# Patient Record
Sex: Female | Born: 1935 | Race: Black or African American | Hispanic: No | Smoking: Never smoker
Health system: Southern US, Community
[De-identification: ages and names within clinical notes are randomized; demographics above are authoritative.]

## PROBLEM LIST (undated history)

## (undated) DIAGNOSIS — I10 Essential (primary) hypertension: Secondary | ICD-10-CM

## (undated) DIAGNOSIS — E785 Hyperlipidemia, unspecified: Secondary | ICD-10-CM

## (undated) DIAGNOSIS — I2510 Atherosclerotic heart disease of native coronary artery without angina pectoris: Secondary | ICD-10-CM

## (undated) HISTORY — DX: Atherosclerotic heart disease of native coronary artery without angina pectoris: I25.10

## (undated) HISTORY — DX: Hyperlipidemia, unspecified: E78.5

## (undated) HISTORY — DX: Essential (primary) hypertension: I10

## (undated) HISTORY — PX: HYSTEROTOMY: SHX1776

## (undated) HISTORY — PX: PTCA: SHX146

---

## 2003-12-05 HISTORY — PX: OTHER SURGICAL HISTORY: SHX169

## 2004-12-04 ENCOUNTER — Inpatient Hospital Stay (HOSPITAL_COMMUNITY): Admission: EM | Admit: 2004-12-04 | Discharge: 2004-12-07 | Payer: Self-pay | Admitting: Family Medicine

## 2008-02-17 ENCOUNTER — Ambulatory Visit: Payer: Self-pay | Admitting: Vascular Surgery

## 2011-04-18 NOTE — Procedures (Signed)
CAROTID DUPLEX EXAM   INDICATION:  Carotid bruit.  Patient experienced one episode of right  hand numbness approximately one year ago.   HISTORY:  Diabetes:  No.  Cardiac:  MI in 2006, stent.  Hypertension:  Yes.  Smoking:  No.  Previous Surgery:  Cardiac.  CV History:  CVA in 2002 (right-sided numbness)  Amaurosis Fugax No, Paresthesias No, Hemiparesis No                                       RIGHT             LEFT  Brachial systolic pressure:         140               150  Brachial Doppler waveforms:         WNL               WNL  Vertebral direction of flow:        Antegrade         Antegrade  DUPLEX VELOCITIES (cm/sec)  CCA peak systolic                   83                72  ECA peak systolic                   85                91  ICA peak systolic                   78                58  ICA end diastolic                   23                17  PLAQUE MORPHOLOGY:                  Calcific          Calcific  PLAQUE AMOUNT:                      Mild              Mild  PLAQUE LOCATION:                    ICA               ICA   IMPRESSION:  1. Tortuous left common carotid artery.  2. Tortuous internal carotid arteries bilaterally, right > left.  3. Bilateral 1-39% internal carotid artery stenoses.  4. Patent external carotid arteries.  5. Bilateral vertebral arteries with antegrade flow.   Preliminary report faxed to Dr. Silva Bandy office on February 17, 2008 at  11:15.   ___________________________________________  Janetta Hora. Fields, MD   PB/MEDQ  D:  02/17/2008  T:  02/17/2008  Job:  [id number]

## 2011-04-21 NOTE — Cardiovascular Report (Signed)
NAME:  BRANDEN, VINE NO.:  [id number]   MEDICAL RECORD NO.:  [id number]          PATIENT TYPE:  INP   LOCATION:  6599                         FACILITY:  [facility]   PHYSICIAN:  Peter M. Swaziland, M.D.  DATE OF BIRTH:  10-Sep-1933   DATE OF PROCEDURE:  DATE OF DISCHARGE:                              CARDIAC CATHETERIZATION   INDICATIONS FOR PROCEDURE:  Ms. Legore, a 77 year old black female with  history of hypertension, presented with non-Q-wave myocardial infarction.  Diagnostic angiogram demonstrated critical stenosis in the distal left  circumflex coronary artery 95%. Chest had an 80% stenosis in the proximal  first obtuse marginal vessel. Access for the diagnostic case was via right  femoral artery using the 6-French sheath. A total of 180 cc of Omnipaque was  used for both the diagnostic and interventional procedure.   MEDICATIONS FOR THE INTERVENTION:  1.  The patient was on IV Integrilin.  2.  She was given heparin 3600 units with subsequent ACT of 267.  3.  She was given nitroglycerin intracoronary 200 mcg x2.  4.  Plavix 300 mg p.o.   The patient tolerated procedure well without complications.   PROCEDURE NOTE:  We initially intervened on the distal circumflex lesion.  Equipment used was a 6-French left __________ 3.5, 0.014 high-torque floppy,  extra support. The lesion was crossed without difficulty. It was predilated  using a 2.5 x 15 mm Maverick balloon dilating to 6 atmospheres. We then  stented this lesion using a 2.5 x 16 mm Taxus drug-eluting stent. This was  deployed at 9 atmospheres and postdilated to 14 atmospheres. This showed an  excellent angiographic result with 0% residual stenosis and TIMI grade 3  flow. We then addressed the first obtuse marginal lesion. Using the same  wire this lesion was also crossed without difficulty. We used, again, the  2.5 x 15 mm Maverick balloon dilating this 6 atmospheres. This was then  stented using a 2.5 x 12  mm Taxus drug-eluting stent. This was dilated to 9  atmospheres and then post dilated to 12 atmospheres with an excellent  angiographic result and 0% residual stenosis.   FINAL INTERPRETATION:  1.  Successful stenting of the distal left circumflex coronary artery and      the first obtuse marginal branch.      PMJ/MEDQ  D:  12/06/2004  T:  12/06/2004  Job:  [id number]   cc:   Elmore Guise., M.D.  [address]  [place], [place] [address]  Fax: [phone]

## 2011-04-21 NOTE — Discharge Summary (Signed)
NAMEKAYLINN, DEDIC             ACCOUNT NO.:  [id number]   MEDICAL RECORD NO.:  [id number]          PATIENT TYPE:  INP   LOCATION:  6533                         FACILITY:  [facility]   PHYSICIAN:  Elmore Guise., M.D.DATE OF BIRTH:  October 10, 1933   DATE OF ADMISSION:  12/04/2004  DATE OF DISCHARGE:  12/07/2004                                 DISCHARGE SUMMARY   DISCHARGE DIAGNOSES:  1.  Coronary artery disease, status post non-ST elevation myocardial      infarction with percutaneous coronary intervention to left circumflex      and obtuse marginal-1.  2.  Dyslipidemia.  3.  Hypertension.   HISTORY OF PRESENT ILLNESS:  The patient is a 77 year old, African-American  female with past medical history of hypertension and dyslipidemia who  presented on December 04, 2004, for evaluation of chest pain.   HOSPITAL COURSE:  The patient was admitted to the hospital for treatment of  non-ST elevation MI.  She was treated aggressively with Lovenox,  Integrillin, aspirin, statin and beta-blocker.  She underwent cardiac  catheterization on December 06, 2004, showing 95% obstruction in the mid  circumflex as well as 60-70% obstruction in a moderate sized OM-1 vessel  found on diagnostic catheterization.  Elective intervention was then  performed by Dr. Peter Swaziland.  The patient had a 2.5 x 16 mm Taxus stent  placed in the mid to distal circumflex with 0% residual as well as a 2.5 x  12 mm Taxus placed at the osteal/proximal portion of the OM-1 with 0%  residual stenosis.   Post procedure, the patient has done well.  She has had no recurrent chest  pain.  She has been up and ambulatory.  Her arteriotomy site looks well with  no significant bruising or hematoma noted.  The patient will be discharged  home today.   DISCHARGE MEDICATIONS:  1.  Atenolol 100 mg daily.  2.  Aspirin 81 mg daily.  3.  Hydrochlorothiazide 25 mg daily.  4.  Lisinopril 40 mg daily.  5.  Crestor 10 mg daily.  6.  Plavix  75 mg daily.  7.  K-Dur 20 mEq daily.  8.  Nitroglycerin on a p.r.n. basis.  9.  Tylenol extra strength one to two every 6 hours as needed.   ACTIVITY:  Restricted to no strenuous activity or heavy lifting greater than  48 hours.  After 48 hours, the patient may slowly start back to normal  routine.   DIET:  Low cholesterol, low fat diet.   WOUND CARE:  Routine post catheterization instructions.   FOLLOW UP:  Follow up with Dr. Rosine Abe of [facility] in 2-  3 weeks and follow up with her primary care physician in 4-6 weeks.  The  patient is to call the office at [phone], for an appointment.       TWK/MEDQ  D:  12/07/2004  T:  12/07/2004  Job:  [id number]

## 2011-04-21 NOTE — H&P (Signed)
NAME:  Amy Stevenson, Amy Stevenson             ACCOUNT NO.:  [id number]   MEDICAL RECORD NO.:  [id number]          PATIENT TYPE:  EMS   LOCATION:  URG                          FACILITY:  [facility]   PHYSICIAN:  Elmore Guise., M.D.DATE OF BIRTH:  [birth year]   DATE OF ADMISSION:  12/04/2004  DATE OF DISCHARGE:                                HISTORY & PHYSICAL   REASON FOR ADMISSION:  Chest pain.   PRIMARY CARE PHYSICIAN:  Dr. Scot Jun __________, [place], [place].   HISTORY OF PRESENT ILLNESS:  A 77 year old African-American female with past  medical history of hypertension and dyslipidemia who presents with off and  on chest pain since Wednesday.  The patient reports increasing activity  secondary to visiting her kids here in [place] and notes occasional  chest pain which she describes as burning and tightness, coming with heavy  exertion.  This radiates to her neck, improves with rest, it is associated  mild dyspnea and diaphoresis.  She denies any chest pain prior to this last  Wednesday, denies any nausea, vomiting, cough, orthopnea, PND, lower  extremity edema, syncope, or presyncope.  No palpitations.  She does report  off and on joint aches for some time which primarily happen in her knees and  ankles.  She lives independently and has no significant exertional  limitations.  She did recently have surgery back in 9/05 secondary to a mass  on her right ovary which was reportedly benign.   REVIEW OF SYSTEMS:  Otherwise negative.   CURRENT MEDICATIONS:  1.  Hydrochlorothiazide 25 mg daily.  2.  Atenolol 100 mg daily (just increased from 50 mg).  3.  Aspirin 325 mg daily.   She stopped her Lipitor and Zetia in the last couple of months.   FAMILY HISTORY:  Positive for cancer in her mother and stroke in her father.   SOCIAL HISTORY:  She lives in [place], [place], she is down visiting her  daughter and son.  She has no tobacco or alcohol.  She is retired at the  current time.   PHYSICAL EXAMINATION:  VITAL SIGNS:  She is afebrile, pulse is in the 70s,  respiratory rate 16, blood pressure 150/70, she is saturating 95% on 2  liters.  GENERAL:  She is a very pleasant, elderly, African-American female alert and  oriented x4 and in no acute distress.  HEENT:  Normal.  NECK:  Supple, no lymphadenopathy, 2+ carotids, no JVD, no bruits.  LUNGS:  Clear.  HEART:  Regular with normal S1/S2, no significant murmurs, rubs or gallops.  ABDOMEN:  Soft, nontender, non-distended.  EXTREMITIES:  Warm with 2+ pulses and no edema.  NEUROLOGIC:  She is intact with no focal deficits.   Chest x-ray is pending.  EKG is normal sinus rhythm, 73 per minute, LVH with  strain pattern noted with T wave inversions in the inferolateral leads.   LABORATORY DATA:  Hemoglobin 13.9.  BUN 14, creatinine 1.0, potassium 3.2.  Coags as well as cardiac enzymes are pending at time of dictation.   IMPRESSION:  1.  Chest pain.  2.  Multiple cardiac risk  factors, including hypertension, dyslipidemia and      age.   PLAN:  We will admit for observation, serial cardiac enzymes, continue  aspirin, beta-blocker, Lovenox, and nitroglycerin.  We will check fasting  lipid panel in the morning.  We will add low-dose ACE inhibitor to her  regimen, obtain serial cardiac enzymes and if enzymes remain negative we  will obtain stress Cardiolite in the morning, otherwise if positive then  catheterize.  Discussed this at length with the patient and son.       TWK/MEDQ  D:  12/04/2004  T:  12/04/2004  Job:  [id number]

## 2011-04-21 NOTE — Cardiovascular Report (Signed)
NAME:  Amy Stevenson, Amy Stevenson             ACCOUNT NO.:  [id number]   MEDICAL RECORD NO.:  [id number]          PATIENT TYPE:  INP   LOCATION:  3743                         FACILITY:  [facility]   PHYSICIAN:  Elmore Guise., M.D.DATE OF BIRTH:  [birth year]   DATE OF PROCEDURE:  DATE OF DISCHARGE:                              CARDIAC CATHETERIZATION   CARDIAC CATHETERIZATION   PROCEDURE:  Left heart catheterization, coronary angiogram, LV angiogram.   PROCEDURE DESCRIPTION:  The patient was brought to the cardiac  catheterization lab after appropriate informed consent.  She was prepped and  draped in a sterile fashion.  A 6 French sheath was placed in the right  femoral artery without difficulty.  Coronary angiogram was then performed  without complication followed by LV angiogram.   FINDINGS:  1.  Left main:  Normal.  2.  LAD:  Moderate-sized vessel wrapping around the apex with mild luminal      irregularities.  3.  LCX:  Is nondominant with 95% mid-obstruction, a small to moderate-sized      vessel.  4.  OM-1:  Has ostial 50-60% stenosis.  5.  RCA:  Is dominant with mild luminal irregularities.  6.  LVEF:  60-65%, no wall motion abnormalities, LVEDP was 18 mmHg.   IMPRESSION:  1.  Single vessel obstructive coronary artery disease.  2.  Preserved left ventricular systolic function.   PLAN:  1.  Maximal medical therapy with ACE, beta blocker, statin, and aspirin.  2.  Elective PCI of circumflex.       TWK/MEDQ  D:  12/06/2004  T:  12/06/2004  Job:  [id number]

## 2012-03-25 ENCOUNTER — Encounter: Payer: Self-pay | Admitting: *Deleted

## 2012-08-08 ENCOUNTER — Encounter: Payer: Self-pay | Admitting: *Deleted

## 2017-11-08 ENCOUNTER — Ambulatory Visit: Payer: Self-pay | Admitting: Interventional Cardiology

## 2017-11-23 DIAGNOSIS — I10 Essential (primary) hypertension: Secondary | ICD-10-CM | POA: Insufficient documentation

## 2017-11-23 DIAGNOSIS — I2510 Atherosclerotic heart disease of native coronary artery without angina pectoris: Secondary | ICD-10-CM | POA: Insufficient documentation

## 2017-11-23 DIAGNOSIS — E785 Hyperlipidemia, unspecified: Secondary | ICD-10-CM | POA: Insufficient documentation

## 2017-12-11 ENCOUNTER — Encounter: Payer: Self-pay | Admitting: Cardiovascular Disease

## 2017-12-11 ENCOUNTER — Ambulatory Visit: Payer: Medicare HMO | Admitting: Cardiovascular Disease

## 2017-12-11 ENCOUNTER — Encounter (INDEPENDENT_AMBULATORY_CARE_PROVIDER_SITE_OTHER): Payer: Self-pay

## 2017-12-11 VITALS — BP 158/86 | HR 60 | Ht 60.0 in | Wt 158.4 lb

## 2017-12-11 DIAGNOSIS — I509 Heart failure, unspecified: Secondary | ICD-10-CM | POA: Diagnosis not present

## 2017-12-11 DIAGNOSIS — I2510 Atherosclerotic heart disease of native coronary artery without angina pectoris: Secondary | ICD-10-CM

## 2017-12-11 DIAGNOSIS — E782 Mixed hyperlipidemia: Secondary | ICD-10-CM | POA: Diagnosis not present

## 2017-12-11 DIAGNOSIS — I447 Left bundle-branch block, unspecified: Secondary | ICD-10-CM

## 2017-12-11 DIAGNOSIS — R002 Palpitations: Secondary | ICD-10-CM | POA: Diagnosis not present

## 2017-12-11 LAB — LIPID PANEL
CHOL/HDL RATIO: 4.4 ratio (ref 0.0–4.4)
CHOLESTEROL TOTAL: 242 mg/dL — AB (ref 100–199)
HDL: 55 mg/dL (ref >39)
LDL CALC: 169 mg/dL — AB (ref 0–99)
Triglycerides: 90 mg/dL (ref 0–149)
VLDL CHOLESTEROL CAL: 18 mg/dL (ref 5–40)

## 2017-12-11 LAB — BASIC METABOLIC PANEL
BUN/Creatinine Ratio: 13 (ref 12–28)
BUN: 12 mg/dL (ref 8–27)
CALCIUM: 9.8 mg/dL (ref 8.7–10.3)
CO2: 24 mmol/L (ref 20–29)
Chloride: 100 mmol/L (ref 96–106)
Creatinine, Ser: 0.93 mg/dL (ref 0.57–1.00)
GFR calc Af Amer: 66 mL/min/{1.73_m2} (ref >59)
GFR, EST NON AFRICAN AMERICAN: 57 mL/min/{1.73_m2} — AB (ref >59)
GLUCOSE: 88 mg/dL (ref 65–99)
Potassium: 4.3 mmol/L (ref 3.5–5.2)
SODIUM: 139 mmol/L (ref 134–144)

## 2017-12-11 LAB — HEPATIC FUNCTION PANEL
ALBUMIN: 3.7 g/dL (ref 3.5–4.7)
ALK PHOS: 190 IU/L — AB (ref 39–117)
ALT: 22 IU/L (ref 0–32)
AST: 28 IU/L (ref 0–40)
Bilirubin Total: 1.1 mg/dL (ref 0.0–1.2)
Bilirubin, Direct: 0.67 mg/dL — ABNORMAL HIGH (ref 0.00–0.40)
Total Protein: 8.6 g/dL — ABNORMAL HIGH (ref 6.0–8.5)

## 2017-12-11 LAB — TSH: TSH: 1.48 u[IU]/mL (ref 0.450–4.500)

## 2017-12-11 NOTE — Progress Notes (Signed)
Cardiology Office Note:    Date:  12/11/2017   ID:  Amy BurgerMargaret Stevenson, DOB 11/28/1933, MRN [id number]  PCP:  System, Pcp Not In  Cardiologist:  Kristeen MissPhilip , MD    Referring MD: No ref. provider found   Problem list 1.  Congestive heart failure 2.  Essential hypertension 3.  Hyperlipidemia 4.  CAD :   S/p stenting of the LCx  - 2006    Chief Complaint  Patient presents with  . Congestive Heart Failure    History of Present Illness:    Pt seen with Daughter, Dennie Bibleat.  Amy Stevenson is a 84 y.o. female with a hx of hypertension, hyperlipidemia with a recent diagnosis of congestive heart failure.  We are asked to see her today by Jolee EwingVanessa Hairston, FNT ( [facility]) for further evaluation of her congestive heart failure. Previous pat of Dr. Reyes IvanKersey She had a myocardial infarction in 2006 and had stenting of her left circumflex artery by Dr. SwazilandJordan.  She has had some progressive shortness of breath for the past couple months.  She was in the hospital the week before Thanksgiving up in [place].  She was diuresed and felt much better.  She had an echocardiogram during that hospitalization.  We do not have the results of that echocardiogram   She was scheduled to have a stress test.  She did not feel up to the stress test and wanted to wait and see me before she reschedule the stress test.  Since the hospitalization she has had some heart flutters. She is sleeping better ( was having PND and orthopnea ) , ankle swelling is better  Has not had any chest pain  Does not get any exercise.   Is able to do her housework .  Lives by herself. Does her own shopping.     Does have some DOE with bringing in the groceries.     Past Medical History:  Diagnosis Date  . Coronary artery disease   . Dyslipidemia   . Hypertension     Past Surgical History:  Procedure Laterality Date  . HYSTERRICTOMY    . PTCA    . 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 aspirin 81 MG tablet Take 81 mg by mouth daily.  Marland Kitchen. atorvastatin (LIPITOR) 40 MG tablet Take 40 mg by mouth at bedtime.  . carvedilol (COREG) 25 MG tablet Take 25 mg by mouth 2 (two) times daily with a meal.  . chlorthalidone (HYGROTON) 25 MG tablet Take 25 mg by mouth daily.  . famotidine (PEPCID) 20 MG tablet Take 20 mg by mouth daily.  . ferrous sulfate 220 (44 Fe) MG/5ML solution Take 220 mg by mouth daily.  . furosemide (LASIX) 20 MG tablet Take 20 mg by mouth. EVERY Monday, Wednesday, FRIDAY  . levothyroxine (SYNTHROID, LEVOTHROID) 25 MCG tablet Take 25 mcg by mouth daily.  Marland Kitchen. lisinopril (PRINIVIL,ZESTRIL) 40 MG tablet Take 40 mg by mouth daily.  . nitroGLYCERIN (NITROSTAT) 0.4 MG SL tablet Place 0.4 mg under the tongue every 5 (five) minutes as needed.     Allergies:   Patient has no known allergies.   Social History   Socioeconomic History  . Marital status: Widowed    Spouse name: None  . Number of children: 2  . Years of education: None  . Highest education level: None  Social Needs  . Financial resource strain: None  . Food insecurity -  worry: None  . Food insecurity - inability: None  . Transportation needs - medical: None  . Transportation needs - non-medical: None  Occupational History  . Occupation: retired  Tobacco Use  . Smoking status: Never Smoker  . Smokeless tobacco: Never Used  Substance and Sexual Activity  . Alcohol use: No  . Drug use: No  . Sexual activity: Not Currently  Other Topics Concern  . None  Social History Narrative  . None     Family History: The patient's family history includes Cancer in her mother and unknown relative; Cancer - Lung in her father; Diabetes in her brother; Stroke in her unknown relative. ROS:   Please see the history of present illness.     All other systems reviewed and are negative.  EKGs/Labs/Other Studies Reviewed:    The following studies were reviewed today:   EKG:  EKG is  ordered today.  The ekg  ordered today demonstrates  NSR at 60. LBBB . No ST or T wave changes.   Recent Labs: No results found for requested labs within last 8760 hours.  Recent Lipid Panel No results found for: CHOL, TRIG, HDL, CHOLHDL, VLDL, LDLCALC, LDLDIRECT  Physical Exam:    VS:  BP (!) 158/86 (BP Location: Left Arm)   Pulse 60   Ht 5' (1.524 m)   Wt 158 lb 6.4 oz (71.8 kg)   SpO2 98%   BMI 30.94 kg/m     Wt Readings from Last 3 Encounters:  12/11/17 158 lb 6.4 oz (71.8 kg)     GEN:  Well nourished, well developed in no acute distress, pleasant elderly female,  HEENT: Normal NECK: No JVD; No carotid bruits LYMPHATICS: No lymphadenopathy CARDIAC: RR, no murmurs, rubs, gallops RESPIRATORY:  Clear to auscultation without rales, wheezing or rhonchi  ABDOMEN: Soft, non-tender, non-distended MUSCULOSKELETAL:  No edema; No deformity  SKIN: Warm and dry NEUROLOGIC:  Alert and oriented x 3 PSYCHIATRIC:  Normal affect   ASSESSMENT:    No diagnosis found. PLAN:    In order of problems listed above:  1. 1.  Acute on chronic congestive heart failure.    Amy Stevenson was hospitalized in November with congestive heart failure.  I do not have the results of the echocardiogram to know whether this is systolic or diastolic or both.  She has a history of a myocardial infarction in 2008 and is status post stenting of her circumflex artery.  She was having lots of PND and orthopnea and now all of the symptoms are significantly better.  She has a left bundle branch block.  She was scheduled to have a stress test up in [place] but she wanted to wait and have a stress test done down here.  We will continue with her same medications.  Will request records from [facility] in [place], [place]   2.  Coronary artery disease: She is status post stenting of her left circumflex artery approximately 10 years ago.  She will continue on aspirin.  She is not having any angina.  2.  Hyperlipidemia: Continue current  medications.  Will check labs today.  3.  Essential hypertension: Advised her to avoid eating excessive salt.   Medication Adjustments/Labs and Tests Ordered: Current medicines are reviewed at length with the patient today.  Concerns regarding medicines are outlined above.  No orders of the defined types were placed in this encounter.  No orders of the defined types were placed in this encounter.   Signed, Kristeen Miss,  MD  12/11/2017 9:19 AM    [facility]

## 2017-12-11 NOTE — Patient Instructions (Signed)
Medication Instructions:  Your physician recommends that you continue on your current medications as directed. Please refer to the Current Medication list given to you today.   Labwork: TODAY - cholesterol, TSH, liver panel, basic metabolic panel   Testing/Procedures: Your physician has requested that you have a lexiscan myoview. For further information please visit [email or web address]. Please follow instruction sheet, as given.   Follow-Up: Your physician recommends that you schedule a follow-up appointment in: 3 months with Dr. Elease HashimotoNahser   If you need a refill on your cardiac medications before your next appointment, please call your pharmacy.   Thank you for choosing [facility]! Eligha BridegroomMichelle Harshal Sirmon, RN [phone]

## 2018-01-31 ENCOUNTER — Telehealth (HOSPITAL_COMMUNITY): Payer: Self-pay | Admitting: *Deleted

## 2018-01-31 NOTE — Telephone Encounter (Signed)
Attempted to call patient regarding upcoming nuclear test, no answer, unable to leave a message.  Amy Stevenson, [name] [name]

## 2018-02-04 ENCOUNTER — Telehealth (HOSPITAL_COMMUNITY): Payer: Self-pay | Admitting: *Deleted

## 2018-02-04 NOTE — Telephone Encounter (Signed)
Attempted to leave message on voicemail in reference to upcoming appointment scheduled for 02/05/18 but mailbox full. Amy Stevenson, Amy Stevenson

## 2018-02-05 ENCOUNTER — Ambulatory Visit (HOSPITAL_COMMUNITY): Payer: Medicare HMO | Attending: Cardiology

## 2018-02-05 DIAGNOSIS — I10 Essential (primary) hypertension: Secondary | ICD-10-CM | POA: Diagnosis not present

## 2018-02-05 DIAGNOSIS — I2510 Atherosclerotic heart disease of native coronary artery without angina pectoris: Secondary | ICD-10-CM

## 2018-02-05 DIAGNOSIS — R002 Palpitations: Secondary | ICD-10-CM | POA: Diagnosis not present

## 2018-02-05 DIAGNOSIS — E782 Mixed hyperlipidemia: Secondary | ICD-10-CM

## 2018-02-05 DIAGNOSIS — R0609 Other forms of dyspnea: Secondary | ICD-10-CM | POA: Insufficient documentation

## 2018-02-05 DIAGNOSIS — I509 Heart failure, unspecified: Secondary | ICD-10-CM | POA: Diagnosis not present

## 2018-02-05 DIAGNOSIS — I447 Left bundle-branch block, unspecified: Secondary | ICD-10-CM

## 2018-02-05 LAB — MYOCARDIAL PERFUSION IMAGING
CHL CUP NUCLEAR SRS: 4
CHL CUP NUCLEAR SSS: 8
CHL CUP RESTING HR STRESS: 64 {beats}/min
LV sys vol: 50 mL
LVDIAVOL: 108 mL (ref 46–106)
NUC STRESS TID: 0.91
Peak HR: 83 {beats}/min
RATE: 0.32
SDS: 4

## 2018-02-05 MED ORDER — TECHNETIUM TC 99M TETROFOSMIN IV KIT
32.3000 | PACK | Freq: Once | INTRAVENOUS | Status: AC | PRN
  Administered 2018-02-05: 32.3 via INTRAVENOUS
  Filled 2018-02-05: qty 33

## 2018-02-05 MED ORDER — REGADENOSON 0.4 MG/5ML IV SOLN
0.4000 mg | Freq: Once | INTRAVENOUS | Status: AC
  Administered 2018-02-05: 0.4 mg via INTRAVENOUS

## 2018-02-05 MED ORDER — TECHNETIUM TC 99M TETROFOSMIN IV KIT
10.9000 | PACK | Freq: Once | INTRAVENOUS | Status: AC | PRN
  Administered 2018-02-05: 10.9 via INTRAVENOUS
  Filled 2018-02-05: qty 11

## 2018-03-07 ENCOUNTER — Ambulatory Visit: Payer: Medicare HMO | Admitting: Cardiovascular Disease

## 2018-03-08 ENCOUNTER — Ambulatory Visit: Payer: Medicare HMO | Admitting: Cardiovascular Disease

## 2018-03-08 ENCOUNTER — Encounter: Payer: Self-pay | Admitting: Cardiovascular Disease

## 2018-03-08 VITALS — BP 172/68 | HR 66 | Ht 60.0 in | Wt 160.0 lb

## 2018-03-08 DIAGNOSIS — I10 Essential (primary) hypertension: Secondary | ICD-10-CM

## 2018-03-08 DIAGNOSIS — E782 Mixed hyperlipidemia: Secondary | ICD-10-CM

## 2018-03-08 MED ORDER — AMLODIPINE BESYLATE 5 MG PO TABS: 5.0000 mg | ORAL_TABLET | Freq: Every day | ORAL | 3 refills | Status: DC

## 2018-03-08 NOTE — Patient Instructions (Addendum)
Medication Instructions:  Your physician has recommended you make the following change in your medication:   START Amlodipine (Norvasc) 5 mg once daily TAKE YOUR Atorvastatin    Labwork: Your physician recommends that you return for lab work in: 3 months on the day of your appointment or a few days before You will need to FAST for this appointment - nothing to eat or drink after midnight the night before except water.   Testing/Procedures: None Ordered   Follow-Up: Your physician recommends that you schedule a follow-up appointment in: 3 months with a PA or NP on Dr. Harvie BridgeNahser's team    If you need a refill on your cardiac medications before your next appointment, please call your pharmacy.   Thank you for choosing [facility]! Eligha BridegroomMichelle Soffia Doshier, RN [phone]

## 2018-03-08 NOTE — Progress Notes (Signed)
Cardiology Office Note:    Date:  03/08/2018   ID:  Amy Stevenson, DOB 12-18-1932, MRN [id number]  PCP:  System, Pcp Not In  Cardiologist:  Kristeen Miss, MD    Referring MD: No ref. provider found   Problem list 1.  Congestive heart failure 2.  Essential hypertension 3.  Hyperlipidemia 4.  CAD :   S/p stenting of the LCx  - 2006    Chief Complaint  Patient presents with  . Hypertension    History of Present Illness:    Pt seen with Daughter, [name].  Amy Stevenson is a 84 y.o. female with a hx of hypertension, hyperlipidemia with a recent diagnosis of congestive heart failure.  We are asked to see her today by [name], FNT ( [place]) for further evaluation of her congestive heart failure. Previous pat of Dr. Reyes Ivan She had a myocardial infarction in 2006 and had stenting of her left circumflex artery by Dr. Sw[place]il[place].  She has had some progressive shortness of breath for the past couple months.  She was in the hospital the week before Thanksgiving up in [place].  She was diuresed and felt much better.  She had an echocardiogram during that hospitalization.  We do not have the results of that echocardiogram   She was scheduled to have a stress test.  She did not feel up to the stress test and wanted to wait and see me before she reschedule the stress test.  Since the hospitalization she has had some heart flutters. She is sleeping better ( was having PND and orthopnea ) , ankle swelling is better  Has not had any chest pain  Does not get any exercise.   Is able to do her housework .  Lives by herself. Does her own shopping.     Does have some DOE with bringing in the groceries.   March 08, 2018:  Amy Stevenson is seen today for follow-up of her hypertension, hyperlipidemia and congestive heart failure. She was seen in March and was reported having some increased shortness of breath.  Myoview study was done which revealed an ejection fraction of 54%.  She did not have any  evidence of ischemia.  Has been under lots of stress  - sister died several months ago and this am found out that her sister in law had passed.  BP is elevated today .  Still using salt regularly   Continues to have some left sided chest pain ( normal myoview last month)  Thinks it may be gas.   Past Medical History:  Diagnosis Date  . Coronary artery disease   . Dyslipidemia   . Hypertension     Past Surgical History:  Procedure Laterality Date  . HYSTEROTOMY    . PTCA    . 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 aspirin 81 MG tablet Take 81 mg by mouth daily.  Marland Kitchen atorvastatin (LIPITOR) 40 MG tablet Take 40 mg by mouth at bedtime.  . carvedilol (COREG) 25 MG tablet Take 25 mg by mouth 2 (two) times daily with a meal.  . chlorthalidone (HYGROTON) 25 MG tablet Take 25 mg by mouth daily.  . famotidine (PEPCID) 20 MG tablet Take 20 mg by mouth daily.  . ferrous sulfate 220 (44 Fe) MG/5ML solution Take 220 mg by mouth daily.  . furosemide (LASIX) 20 MG tablet Take 20 mg by mouth. EVERY Monday, Wednesday, FRIDAY  .  levothyroxine (SYNTHROID, LEVOTHROID) 25 MCG tablet Take 25 mcg by mouth daily.  Marland Kitchen lisinopril (PRINIVIL,ZESTRIL) 40 MG tablet Take 40 mg by mouth daily.  . nitroGLYCERIN (NITROSTAT) 0.4 MG SL tablet Place 0.4 mg under the tongue every 5 (five) minutes as needed.     Allergies:   Patient has no known allergies.   Social History   Socioeconomic History  . Marital status: Widowed    Spouse name: Not on file  . Number of children: 2  . Years of education: Not on file  . Highest education level: Not on file  Occupational History  . Occupation: retired  Engineer, production  . Financial resource strain: Not on file  . Food insecurity:    Worry: Not on file    Inability: Not on file  . Transportation needs:    Medical: Not on file    Non-medical: Not on file  Tobacco Use  . Smoking status: Never Smoker  . Smokeless  tobacco: Never Used  Substance and Sexual Activity  . Alcohol use: No  . Drug use: No  . Sexual activity: Not Currently  Lifestyle  . Physical activity:    Days per week: Not on file    Minutes per session: Not on file  . Stress: Not on file  Relationships  . Social connections:    Talks on phone: Not on file    Gets together: Not on file    Attends religious service: Not on file    Active member of club or organization: Not on file    Attends meetings of clubs or organizations: Not on file    Relationship status: Not on file  Other Topics Concern  . Not on file  Social History Narrative  . Not on file     Family History: The patient's family history includes Cancer in her mother and unknown relative; Cancer - Lung in her father; Diabetes in her brother; Stroke in her unknown relative. ROS:   Please see the history of present illness.     All other systems reviewed and are negative.  EKGs/Labs/Other Studies Reviewed:    The following studies were reviewed today:  EKG:  EKG is  ordered today.  The ekg ordered today demonstrates  NSR at 60. LBBB . No ST or T wave changes.   Recent Labs: 12/11/2017: ALT 22; BUN 12; Creatinine, Ser 0.93; Potassium 4.3; Sodium 139; TSH 1.480  Recent Lipid Panel    Component Value Date/Time   CHOL 242 (H) 12/11/2017 1025   TRIG 90 12/11/2017 1025   HDL 55 12/11/2017 1025   CHOLHDL 4.4 12/11/2017 1025   LDLCALC 169 (H) 12/11/2017 1025   Physical Exam: Blood pressure (!) 172/68, pulse 66, height 5' (1.524 m), weight 160 lb (72.6 kg), SpO2 97 %.  GEN:  Well nourished, well developed in no acute distress HEENT: Normal NECK: No JVD; No carotid bruits LYMPHATICS: No lymphadenopathy CARDIAC: RR  no murmurs, rubs, gallops RESPIRATORY:  Clear to auscultation without rales, wheezing or rhonchi  ABDOMEN: Soft, non-tender, non-distended MUSCULOSKELETAL:  No edema; No deformity  SKIN: Warm and dry NEUROLOGIC:  Alert and oriented x  3   ASSESSMENT:    1. Mixed hyperlipidemia   2. Essential hypertension    PLAN:    In order of problems listed above:  1.  Acute on chronic congestive heart failure.      has presumed diastolic CHF.     Needs better BP control  2.  Coronary artery disease:  She is status post stenting of her left circumflex artery approximately 10 years ago.  She will continue on aspirin.  She is not having any angina.  2.  Hyperlipidemia: Lipids were elevated the last time we checked.  She had not been taking the atorvastatin on a regular basis. She still is not taking atorvastatin on a regular basis.  We have encouraged her to take atorvastatin regularly so that we can get her cholesterol levels down.  3.  Essential hypertension: Advised her to avoid eating excessive salt. Start Amlodipine 5 mg a day .   Medication Adjustments/Labs and Tests Ordered: Current medicines are reviewed at length with the patient today.  Concerns regarding medicines are outlined above.  Orders Placed This Encounter  Procedures  . Lipid Profile  . Basic Metabolic Panel (BMET)  . Hepatic function panel   Meds ordered this encounter  Medications  . amLODipine (NORVASC) 5 MG tablet    Sig: Take 1 tablet (5 mg total) by mouth daily.    Dispense:  90 tablet    Refill:  3    Signed, Kristeen MissPhilip Nahser, MD  03/08/2018 10:38 AM    [facility]

## 2018-06-04 ENCOUNTER — Ambulatory Visit: Payer: Medicare HMO | Admitting: Cardiovascular Disease

## 2018-06-04 ENCOUNTER — Other Ambulatory Visit: Payer: Medicare HMO | Admitting: *Deleted

## 2018-06-04 ENCOUNTER — Encounter: Payer: Self-pay | Admitting: Cardiovascular Disease

## 2018-06-04 VITALS — BP 150/58 | HR 68 | Ht 60.0 in | Wt 159.0 lb

## 2018-06-04 DIAGNOSIS — E782 Mixed hyperlipidemia: Secondary | ICD-10-CM | POA: Diagnosis not present

## 2018-06-04 DIAGNOSIS — I10 Essential (primary) hypertension: Secondary | ICD-10-CM

## 2018-06-04 DIAGNOSIS — I5032 Chronic diastolic (congestive) heart failure: Secondary | ICD-10-CM

## 2018-06-04 LAB — LIPID PANEL
CHOLESTEROL TOTAL: 225 mg/dL — AB (ref 100–199)
Chol/HDL Ratio: 4.1 ratio (ref 0.0–4.4)
HDL: 55 mg/dL (ref >39)
LDL Calculated: 154 mg/dL — ABNORMAL HIGH (ref 0–99)
TRIGLYCERIDES: 78 mg/dL (ref 0–149)
VLDL Cholesterol Cal: 16 mg/dL (ref 5–40)

## 2018-06-04 LAB — HEPATIC FUNCTION PANEL
ALBUMIN: 4.1 g/dL (ref 3.5–4.7)
ALT: 20 IU/L (ref 0–32)
AST: 28 IU/L (ref 0–40)
Alkaline Phosphatase: 144 IU/L — ABNORMAL HIGH (ref 39–117)
BILIRUBIN TOTAL: 0.4 mg/dL (ref 0.0–1.2)
BILIRUBIN, DIRECT: 0.16 mg/dL (ref 0.00–0.40)
Total Protein: 7.5 g/dL (ref 6.0–8.5)

## 2018-06-04 LAB — BASIC METABOLIC PANEL
BUN/Creatinine Ratio: 10 — ABNORMAL LOW (ref 12–28)
BUN: 9 mg/dL (ref 8–27)
CALCIUM: 9.4 mg/dL (ref 8.7–10.3)
CO2: 22 mmol/L (ref 20–29)
Chloride: 105 mmol/L (ref 96–106)
Creatinine, Ser: 0.89 mg/dL (ref 0.57–1.00)
GFR, EST AFRICAN AMERICAN: 69 mL/min/{1.73_m2} (ref >59)
GFR, EST NON AFRICAN AMERICAN: 60 mL/min/{1.73_m2} (ref >59)
Glucose: 83 mg/dL (ref 65–99)
Potassium: 5 mmol/L (ref 3.5–5.2)
Sodium: 142 mmol/L (ref 134–144)

## 2018-06-04 MED ORDER — CHLORTHALIDONE 25 MG PO TABS: 25.0000 mg | ORAL_TABLET | Freq: Every day | ORAL | 3 refills | Status: DC

## 2018-06-04 MED ORDER — FUROSEMIDE 20 MG PO TABS: 20.0000 mg | ORAL_TABLET | ORAL | 3 refills | Status: DC

## 2018-06-04 NOTE — Patient Instructions (Signed)
Medication Instructions:  Your physician recommends that you continue on your current medications as directed. Please refer to the Current Medication list given to you today.   Labwork: Your physician recommends that you return for lab work in: 6 months on the day of or a few days before your office visit   You will need to FAST for this appointment - nothing to eat or drink after midnight the night before except water.   Testing/Procedures: None Ordered   Follow-Up: Your physician wants you to follow-up in: 6 months with a PA or NP on Dr. Nahser's team. You will receive a reminder letter in the mail two months in advance. If you don't receive a letter, please call our office to schedule the follow-up appointment.   If you need a refill on your cardiac medications before your next appointment, please call your pharmacy.   Thank you for choosing [facility]! Chrsitopher Wik, RN [phone]    

## 2018-06-04 NOTE — Progress Notes (Signed)
Cardiology Office Note:    Date:  06/04/2018   ID:  Amy Stevenson, DOB Apr 02, 1933, MRN [id number]  PCP:  System, Pcp Not In  Cardiologist:  Kristeen Miss, MD    Referring MD: No ref. provider found   Problem list 1.  Congestive heart failure 2.  Essential hypertension 3.  Hyperlipidemia 4.  CAD :   S/p stenting of the LCx  - 2006    Chief Complaint  Patient presents with  . Congestive Heart Failure  . Hyperlipidemia     Pt seen with Daughter, Amy Stevenson.  Amy Stevenson is a 84 y.o. female with a hx of hypertension, hyperlipidemia with a recent diagnosis of congestive heart failure.  We are asked to see her today by [name], FNT ( [place]) for further evaluation of her congestive heart failure. Previous pat of Dr. Reyes Ivan She had a myocardial infarction in 2006 and had stenting of her left circumflex artery by Dr. [place].  She has had some progressive shortness of breath for the past couple months.  She was in the hospital the week before Thanksgiving up in [place].  She was diuresed and felt much better.  She had an echocardiogram during that hospitalization.  We do not have the results of that echocardiogram   She was scheduled to have a stress test.  She did not feel up to the stress test and wanted to wait and see me before she reschedule the stress test.  Since the hospitalization she has had some heart flutters. She is sleeping better ( was having PND and orthopnea ) , ankle swelling is better  Has not had any chest pain  Does not get any exercise.   Is able to do her housework .  Lives by herself. Does her own shopping.     Does have some DOE with bringing in the groceries.   March 08, 2018:  [name] is seen today for follow-up of her hypertension, hyperlipidemia and congestive heart failure. She was seen in March and was reported having some increased shortness of breath.  Myoview study was done which revealed an ejection fraction of 54%.  She did not have any  evidence of ischemia.  Has been under lots of stress  - sister died several months ago and this am found out that her sister in law had passed.  BP is elevated today .  Still using salt regularly   Continues to have some left sided chest pain ( normal myoview last month)  Thinks it may be gas.   June 04, 2018:  Amy Stevenson is seen today for follow-up of her chronic diastolic congestive heart failure, hypertension and hyperlipidemia.  She has a remote history of coronary artery disease with stenting of the left circumflex artery in 2006.    Is taking her meds.    Still eats salt frequently  Ran out of her Chlorthalidone and lasix last week   Past Medical History:  Diagnosis Date  . Coronary artery disease   . Dyslipidemia   . Hypertension     Past Surgical History:  Procedure Laterality Date  . HYSTEROTOMY    . PTCA    . 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 amLODipine (NORVASC) 5 MG tablet Take 1 tablet (5 mg total) by mouth daily.  Marland Kitchen aspirin 81 MG tablet Take 81 mg by mouth daily.  Marland Kitchen atorvastatin (LIPITOR) 40 MG tablet Take 40 mg  by mouth at bedtime.  . carvedilol (COREG) 25 MG tablet Take 25 mg by mouth 2 (two) times daily with a meal.  . famotidine (PEPCID) 20 MG tablet Take 20 mg by mouth daily.  . ferrous sulfate 220 (44 Fe) MG/5ML solution Take 220 mg by mouth daily.  Marland Kitchen levothyroxine (SYNTHROID, LEVOTHROID) 25 MCG tablet Take 25 mcg by mouth daily.  Marland Kitchen lisinopril (PRINIVIL,ZESTRIL) 40 MG tablet Take 40 mg by mouth daily.  . nitroGLYCERIN (NITROSTAT) 0.4 MG SL tablet Place 0.4 mg under the tongue every 5 (five) minutes as needed.  . [DISCONTINUED] chlorthalidone (HYGROTON) 25 MG tablet Take 25 mg by mouth daily.  . [DISCONTINUED] furosemide (LASIX) 20 MG tablet Take 20 mg by mouth. EVERY Monday, Wednesday, FRIDAY     Allergies:   Patient has no known allergies.   Social History   Socioeconomic History  . Marital  status: Widowed    Spouse name: Not on file  . Number of children: 2  . Years of education: Not on file  . Highest education level: Not on file  Occupational History  . Occupation: retired  Engineer, production  . Financial resource strain: Not on file  . Food insecurity:    Worry: Not on file    Inability: Not on file  . Transportation needs:    Medical: Not on file    Non-medical: Not on file  Tobacco Use  . Smoking status: Never Smoker  . Smokeless tobacco: Never Used  Substance and Sexual Activity  . Alcohol use: No  . Drug use: No  . Sexual activity: Not Currently  Lifestyle  . Physical activity:    Days per week: Not on file    Minutes per session: Not on file  . Stress: Not on file  Relationships  . Social connections:    Talks on phone: Not on file    Gets together: Not on file    Attends religious service: Not on file    Active member of club or organization: Not on file    Attends meetings of clubs or organizations: Not on file    Relationship status: Not on file  Other Topics Concern  . Not on file  Social History Narrative  . Not on file     Family History: The patient's family history includes Cancer in her mother and unknown relative; Cancer - Lung in her father; Diabetes in her brother; Stroke in her unknown relative. ROS:   Please see the history of present illness.     All other systems reviewed and are negative.  EKGs/Labs/Other Studies Reviewed:    The following studies were reviewed today:  EKG:   Recent Labs: 12/11/2017: ALT 22; BUN 12; Creatinine, Ser 0.93; Potassium 4.3; Sodium 139; TSH 1.480  Recent Lipid Panel    Component Value Date/Time   CHOL 242 (H) 12/11/2017 1025   TRIG 90 12/11/2017 1025   HDL 55 12/11/2017 1025   CHOLHDL 4.4 12/11/2017 1025   LDLCALC 169 (H) 12/11/2017 1025   Physical Exam: Blood pressure (!) 150/58, pulse 68, height 5' (1.524 m), weight 159 lb (72.1 kg), SpO2 98 %. ( BP is likely elevated because she has been  out of Lasix and chlorthalidone for a week )   GEN:   Elderly , frail female, NAD  HEENT: Normal NECK: No JVD; No carotid bruits LYMPHATICS: No lymphadenopathy CARDIAC: RR, no murmurs, rubs, gallops RESPIRATORY:  Clear to auscultation without rales, wheezing or rhonchi  ABDOMEN: Soft, non-tender, non-distended  MUSCULOSKELETAL:  No edema; No deformity  SKIN: Warm and dry NEUROLOGIC:  Alert and oriented x 3    ASSESSMENT:    1. Mixed hyperlipidemia   2. Chronic diastolic heart failure (HCC)   3. Essential hypertension    PLAN:    In order of problems listed above:  1.  Acute on chronic congestive heart failure.    Seems to be fairly stable.  Continue current medications.  Will refill her chlorthalidone and Lasix.    2.  Coronary artery disease: She is status post stenting of her left circumflex artery approximately 10 years ago.  She denies any episodes of angina.  2.  Hyperlipidemia: Her last lipid levels were fairly elevated.  She admits that she does not take the atorvastatin on a regular basis.  3.  Essential hypertension:     To new current medications.  Medication Adjustments/Labs and Tests Ordered: Current medicines are reviewed at length with the patient today.  Concerns regarding medicines are outlined above.  Orders Placed This Encounter  Procedures  . Lipid Profile  . Basic Metabolic Panel (BMET)  . Hepatic function panel   Meds ordered this encounter  Medications  . furosemide (LASIX) 20 MG tablet    Sig: Take 1 tablet (20 mg total) by mouth 3 (three) times a week. EVERY Monday, Wednesday, FRIDAY    Dispense:  36 tablet    Refill:  3  . chlorthalidone (HYGROTON) 25 MG tablet    Sig: Take 1 tablet (25 mg total) by mouth daily.    Dispense:  90 tablet    Refill:  3    Signed, Kristeen MissPhilip Secundino Ellithorpe, MD  06/04/2018 10:33 AM    [facility]

## 2018-06-28 IMAGING — NM NM MISC PROCEDURE
6 series · 36 of 36 positions shown · non-contrast
Comparison: none

[Series 1: wbr_s-proj_st stress-sum-em · 6.40mm/px · 6 of 64 frames shown]
[frame 6/64]
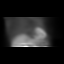
[frame 16/64]
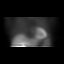
[frame 27/64]
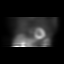
[frame 38/64]
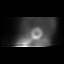
[frame 48/64]
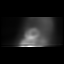
[frame 59/64]
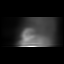

[Series 1: wbr_s-proj_st stress-gsp · 6.40mm/px · 6 of 512 frames shown]
[frame 43/512]
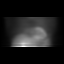
[frame 128/512]
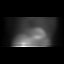
[frame 214/512]
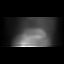
[frame 299/512]
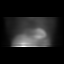
[frame 384/512]
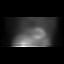
[frame 470/512]
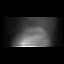

[Series 1: wbr_r-proj_st rest · 6.40mm/px · 6 of 64 frames shown]
[frame 6/64]
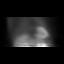
[frame 16/64]
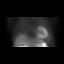
[frame 27/64]
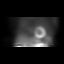
[frame 38/64]
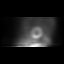
[frame 48/64]
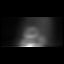
[frame 59/64]
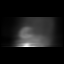

[Series 1: stress-gsp · 6.40mm/px · 6 of 511 frames shown]
[frame 43/511]
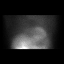
[frame 128/511]
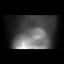
[frame 213/511]
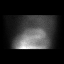
[frame 298/511]
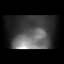
[frame 383/511]
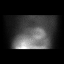
[frame 469/511]
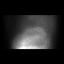

[Series 1: stress-sum-em · 6.40mm/px · 6 of 64 frames shown]
[frame 6/64]
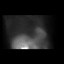
[frame 16/64]
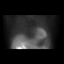
[frame 27/64]
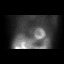
[frame 38/64]
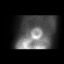
[frame 48/64]
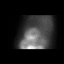
[frame 59/64]
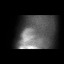

[Series 1: rest · 6.40mm/px · 6 of 64 frames shown]
[frame 6/64]
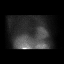
[frame 16/64]
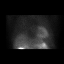
[frame 27/64]
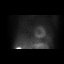
[frame 38/64]
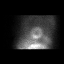
[frame 48/64]
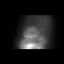
[frame 59/64]
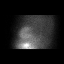

[36 of 36 positions shown; findings below may reference images not displayed]

Canned report from images found in remote index.

Refer to host system for actual result text.

## 2018-12-05 ENCOUNTER — Encounter: Payer: Self-pay | Admitting: Cardiology

## 2018-12-05 ENCOUNTER — Ambulatory Visit: Payer: Medicare HMO | Admitting: Cardiology

## 2018-12-05 ENCOUNTER — Telehealth: Payer: Self-pay | Admitting: Cardiology

## 2018-12-05 VITALS — BP 138/58 | HR 74 | Ht 60.0 in | Wt 153.4 lb

## 2018-12-05 DIAGNOSIS — I2510 Atherosclerotic heart disease of native coronary artery without angina pectoris: Secondary | ICD-10-CM | POA: Diagnosis not present

## 2018-12-05 DIAGNOSIS — I5032 Chronic diastolic (congestive) heart failure: Secondary | ICD-10-CM | POA: Diagnosis not present

## 2018-12-05 DIAGNOSIS — I10 Essential (primary) hypertension: Secondary | ICD-10-CM | POA: Diagnosis not present

## 2018-12-05 DIAGNOSIS — E785 Hyperlipidemia, unspecified: Secondary | ICD-10-CM

## 2018-12-05 MED ORDER — ROSUVASTATIN CALCIUM 20 MG PO TABS: 20.0000 mg | ORAL_TABLET | Freq: Every day | ORAL | 3 refills | Status: DC

## 2018-12-05 NOTE — Patient Instructions (Signed)
Medication Instructions:  STOP : Atorvastatin   START: Rosuvastatin (crestor) 20 mg daily   If you need a refill on your cardiac medications before your next appointment, please call your pharmacy.   Lab work: TODAY: Nurse, children's recommends that you return for a FASTING lipid profile: in 6 weeks  If you have labs (blood work) drawn today and your tests are completely normal, you will receive your results only by: Marland Kitchen MyChart Message (if you have MyChart) OR . A paper copy in the mail If you have any lab test that is abnormal or we need to change your treatment, we will call you to review the results.  Testing/Procedures: None  Follow-Up: At [facility], you and your health needs are our priority.  As part of our continuing mission to provide you with exceptional heart care, we have created designated Provider Care Teams.  These Care Teams include your primary Cardiologist (physician) and Advanced Practice Providers (APPs -  Physician Assistants and Nurse Practitioners) who all work together to provide you with the care you need, when you need it. You will need a follow up appointment in:  6 months.  Please call our office 2 months in advance to schedule this appointment.  You may see Kristeen Miss, MD or one of the following Advanced Practice Providers on your designated Care Team: Tereso Newcomer, PA-C [place], [place] . Berton Bon, NP  Any Other Special Instructions Will Be Listed Below (If Applicable).

## 2018-12-05 NOTE — Telephone Encounter (Signed)
Medical records requested from [facility] (Dr. Altamease Oiler) 12/05/18 vlm

## 2018-12-05 NOTE — Progress Notes (Signed)
Cardiology Office Note:    Date:  12/05/2018   ID:  Amy Stevenson, DOB 07/26/1933, MRN [id number]  PCP:  System, Pcp Not In  Cardiologist:  Kristeen MissPhilip Nahser, MD  Referring MD: No ref. provider found   Chief Complaint  Patient presents with  . Follow-up    CAD, CHF    History of Present Illness:    Amy Stevenson is a 85 y.o. female with a past medical history significant for CAD MI and s/p stent to circumflex 2006, CHF, hypertension, hyperlipidemia.  An evaluation of her HF a Myoview was done in 02/2018 and was normal.  Amy Stevenson was last seen in the office by Dr. Elease HashimotoNahser 06/04/2018 at which time she was doing well on chlorthalidone and Lasix.  Dr. Elease HashimotoNahser did note that she was still eating salty food frequently.  Amy Stevenson is here today for follow-up alone. She does not exercise or walk much but she does housework and a lot of cooking. She does her shopping. She lost her youngest sister in March and her brother last month. She had a lot to do with those losses. Her children and grandchildren keep her busy. She has had no chest pain/pressure, shortness of breath. No edema or orthopnea.  Pt reports having a long time murmur and she thinks she had an [place] fairly recently [place] Leonie GreenLingle.   Home BPs 120's-130's/50's.   Past Medical History:  Diagnosis Date  . Coronary artery disease   . Dyslipidemia   . Hypertension     Past Surgical History:  Procedure Laterality Date  . HYSTEROTOMY    . PTCA    . 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 amLODipine (NORVASC) 5 MG tablet Take 1 tablet (5 mg total) by mouth daily.  Marland Kitchen. aspirin 81 MG tablet Take 81 mg by mouth daily.  . carvedilol (COREG) 25 MG tablet Take 25 mg by mouth 2 (two) times daily with a meal.  . chlorthalidone (HYGROTON) 25 MG tablet Take 1 tablet (25 mg total) by mouth daily.  . famotidine (PEPCID) 20 MG tablet Take 20 mg by mouth daily.  . furosemide  (LASIX) 20 MG tablet Take 1 tablet (20 mg total) by mouth 3 (three) times a week. EVERY Monday, Wednesday, FRIDAY  . levothyroxine (SYNTHROID, LEVOTHROID) 25 MCG tablet Take 25 mcg by mouth daily.  Marland Kitchen. lisinopril (PRINIVIL,ZESTRIL) 40 MG tablet Take 40 mg by mouth daily.  . nitroGLYCERIN (NITROSTAT) 0.4 MG SL tablet Place 0.4 mg under the tongue every 5 (five) minutes as needed.  . [DISCONTINUED] atorvastatin (LIPITOR) 40 MG tablet Take 40 mg by mouth at bedtime.     Allergies:   Patient has no known allergies.   Social History   Socioeconomic History  . Marital status: Widowed    Spouse name: Not on file  . Number of children: 2  . Years of education: Not on file  . Highest education level: Not on file  Occupational History  . Occupation: retired  Engineer, productionocial Needs  . Financial resource strain: Not on file  . Food insecurity:    Worry: Not on file    Inability: Not on file  . Transportation needs:    Medical: Not on file    Non-medical: Not on file  Tobacco Use  . Smoking status: Never Smoker  . Smokeless tobacco: Never Used  Substance and Sexual Activity  . Alcohol use: No  .  Drug use: No  . Sexual activity: Not Currently  Lifestyle  . Physical activity:    Days per week: Not on file    Minutes per session: Not on file  . Stress: Not on file  Relationships  . Social connections:    Talks on phone: Not on file    Gets together: Not on file    Attends religious service: Not on file    Active member of club or organization: Not on file    Attends meetings of clubs or organizations: Not on file    Relationship status: Not on file  Other Topics Concern  . Not on file  Social History Narrative  . Not on file     Family History: The patient's family history includes Cancer in her mother and another family member; Cancer - Lung in her father; Diabetes in her brother; Stroke in an other family member. ROS:   Please see the history of present illness.     All other systems  reviewed and are negative.  EKGs/Labs/Other Studies Reviewed:    The following studies were reviewed today:  Lexiscan Myoview 02/05/2018 Study Highlights    Nuclear stress EF: 54%.  There was no ST segment deviation noted during stress.  The study is normal.   Normal pharmacologic nuclear stress test with no evidence for prior infarct or ischemia.       EKG:  EKG is not ordered today.    Recent Labs: 12/11/2017: TSH 1.480 06/04/2018: ALT 20; BUN 9; Creatinine, Ser 0.89; Potassium 5.0; Sodium 142   Recent Lipid Panel    Component Value Date/Time   CHOL 225 (H) 06/04/2018 0833   TRIG 78 06/04/2018 0833   HDL 55 06/04/2018 0833   CHOLHDL 4.1 06/04/2018 0833   LDLCALC 154 (H) 06/04/2018 0833    Physical Exam:    VS:  BP (!) 138/58   Pulse 74   Ht 5' (1.524 m)   Wt 153 lb 6.4 oz (69.6 kg)   SpO2 98%   BMI 29.96 kg/m     Wt Readings from Last 3 Encounters:  12/05/18 153 lb 6.4 oz (69.6 kg)  06/04/18 159 lb (72.1 kg)  03/08/18 160 lb (72.6 kg)     Physical Exam  Constitutional: She is oriented to person, place, and time. She appears well-developed and well-nourished.  HENT:  Head: Normocephalic and atraumatic.  Neck: Normal range of motion. Neck supple. No JVD present.  Cardiovascular: Normal rate, regular rhythm and intact distal pulses. Exam reveals no gallop and no friction rub.  Murmur heard.  Harsh midsystolic murmur is present with a grade of 2/6 at the upper right sternal border radiating to the neck. Pulmonary/Chest: Effort normal and breath sounds normal. No respiratory distress. She has no wheezes. She has no rales.  Abdominal: Soft. Bowel sounds are normal.  Musculoskeletal: Normal range of motion.        General: No deformity or edema.  Neurological: She is alert and oriented to person, place, and time.  Skin: Skin is warm and dry.  Psychiatric: She has a normal mood and affect. Her behavior is normal. Judgment and thought content normal.  Vitals  reviewed.    ASSESSMENT:    1. Chronic diastolic heart failure (HCC)   2. Coronary artery disease involving native coronary artery of native heart without angina pectoris   3. Dyslipidemia   4. Essential hypertension    PLAN:    In order of problems listed above:  1.  Chronic diastolic heart failure: Volume stable. Continue lasix 3 days per week.   2.  CAD: S/p MI and DES to the circumflex in 2006.  She continues on aspirin, BB and statin. No angina.   3.  Hyperlipidemia: On atorvastatin 40 mg.  LDL was 154 on 06/04/2018 at which time the patient had admitted not taking her statin regularly.She thinks her diet has been better. She is not very clear on how regularly she is taking statin, she says atorvastatin causes her leg cramps. She says she took crestor in the past without problems and would like to try that again. She is not too keen on considering PCSK9I at this time.   4.  Hypertension: Amlodipine 5 mg, carvedilol 25 mg twice daily, chlorthalidone 25 mg, Lasix 20 mg 3 times per week, lisinopril 40 mg. BP well controlled.  Will update metabolic panel  5. Cardiac murmur in aortic region: pt states that she has had known murmur since the 1980's. I discussed doing echo to evaluate but she says she had recent echo in [place] per Dr. Leonie GreenLingle. Will try to get that record.    Medication Adjustments/Labs and Tests Ordered: Current medicines are reviewed at length with the patient today.  Concerns regarding medicines are outlined above. Labs and tests ordered and medication changes are outlined in the patient instructions below:  Patient Instructions  Medication Instructions:  STOP : Atorvastatin   START: Rosuvastatin (crestor) 20 mg daily   If you need a refill on your cardiac medications before your next appointment, please call your pharmacy.   Lab work: TODAY: Nurse, children'sBMET  Your physician recommends that you return for a FASTING lipid profile: in 6 weeks  If you have labs (blood work)  drawn today and your tests are completely normal, you will receive your results only by: Marland Kitchen. MyChart Message (if you have MyChart) OR . A paper copy in the mail If you have any lab test that is abnormal or we need to change your treatment, we will call you to review the results.  Testing/Procedures: None  Follow-Up: At [facility], you and your health needs are our priority.  As part of our continuing mission to provide you with exceptional heart care, we have created designated Provider Care Teams.  These Care Teams include your primary Cardiologist (physician) and Advanced Practice Providers (APPs -  Physician Assistants and Nurse Practitioners) who all work together to provide you with the care you need, when you need it. You will need a follow up appointment in:  6 months.  Please call our office 2 months in advance to schedule this appointment.  You may see Kristeen MissPhilip Nahser, MD or one of the following Advanced Practice Providers on your designated Care Team: Tereso NewcomerScott Weaver, PA-C [place], [place] . Berton BonJanine Kinzi Frediani, NP  Any Other Special Instructions Will Be Listed Below (If Applicable).       Signed, Berton BonJanine Alamin Mccuiston, NP  12/05/2018 6:34 PM    [facility]

## 2018-12-06 ENCOUNTER — Telehealth: Payer: Self-pay | Admitting: Cardiology

## 2018-12-06 LAB — LIPID PANEL
CHOLESTEROL TOTAL: 220 mg/dL — AB (ref 100–199)
Chol/HDL Ratio: 4.1 ratio (ref 0.0–4.4)
HDL: 54 mg/dL (ref >39)
LDL CALC: 130 mg/dL — AB (ref 0–99)
TRIGLYCERIDES: 178 mg/dL — AB (ref 0–149)
VLDL CHOLESTEROL CAL: 36 mg/dL (ref 5–40)

## 2018-12-06 LAB — BASIC METABOLIC PANEL
BUN/Creatinine Ratio: 18 (ref 12–28)
BUN: 19 mg/dL (ref 8–27)
CALCIUM: 9.3 mg/dL (ref 8.7–10.3)
CHLORIDE: 99 mmol/L (ref 96–106)
CO2: 25 mmol/L (ref 20–29)
Creatinine, Ser: 1.05 mg/dL — ABNORMAL HIGH (ref 0.57–1.00)
GFR calc Af Amer: 57 mL/min/{1.73_m2} — ABNORMAL LOW (ref >59)
GFR calc non Af Amer: 49 mL/min/{1.73_m2} — ABNORMAL LOW (ref >59)
GLUCOSE: 116 mg/dL — AB (ref 65–99)
Potassium: 3.9 mmol/L (ref 3.5–5.2)
Sodium: 140 mmol/L (ref 134–144)

## 2018-12-06 NOTE — Telephone Encounter (Signed)
Medical records received from [facility]. 12/06/18 vlm

## 2019-01-16 ENCOUNTER — Other Ambulatory Visit: Payer: Medicare HMO | Admitting: *Deleted

## 2019-01-16 ENCOUNTER — Telehealth: Payer: Self-pay

## 2019-01-16 DIAGNOSIS — E782 Mixed hyperlipidemia: Secondary | ICD-10-CM

## 2019-01-16 LAB — HEPATIC FUNCTION PANEL
ALK PHOS: 81 IU/L (ref 39–117)
ALT: 13 IU/L (ref 0–32)
AST: 18 IU/L (ref 0–40)
Albumin: 4 g/dL (ref 3.6–4.6)
Bilirubin Total: 0.3 mg/dL (ref 0.0–1.2)
Bilirubin, Direct: 0.09 mg/dL (ref 0.00–0.40)
TOTAL PROTEIN: 7.6 g/dL (ref 6.0–8.5)

## 2019-01-16 LAB — LIPID PANEL
CHOLESTEROL TOTAL: 156 mg/dL (ref 100–199)
Chol/HDL Ratio: 2.7 ratio (ref 0.0–4.4)
HDL: 57 mg/dL (ref >39)
LDL Calculated: 81 mg/dL (ref 0–99)
TRIGLYCERIDES: 88 mg/dL (ref 0–149)
VLDL CHOLESTEROL CAL: 18 mg/dL (ref 5–40)

## 2019-01-16 NOTE — Telephone Encounter (Signed)
ORDERS

## 2019-04-12 ENCOUNTER — Other Ambulatory Visit: Payer: Self-pay | Admitting: Cardiovascular Disease

## 2019-06-11 ENCOUNTER — Telehealth: Payer: Self-pay | Admitting: Cardiology

## 2019-06-11 ENCOUNTER — Encounter: Payer: Self-pay | Admitting: Cardiology

## 2019-06-11 NOTE — Telephone Encounter (Signed)

## 2019-06-12 ENCOUNTER — Ambulatory Visit: Payer: Medicare HMO | Admitting: Cardiology

## 2019-06-16 ENCOUNTER — Other Ambulatory Visit: Payer: Self-pay | Admitting: Cardiovascular Disease

## 2019-06-19 ENCOUNTER — Other Ambulatory Visit: Payer: Self-pay

## 2019-06-19 ENCOUNTER — Encounter: Payer: Self-pay | Admitting: Cardiovascular Disease

## 2019-06-19 ENCOUNTER — Telehealth (INDEPENDENT_AMBULATORY_CARE_PROVIDER_SITE_OTHER): Payer: Medicare HMO | Admitting: Cardiovascular Disease

## 2019-06-19 VITALS — BP 129/63 | HR 67 | Ht 60.0 in | Wt 156.0 lb

## 2019-06-19 DIAGNOSIS — I5032 Chronic diastolic (congestive) heart failure: Secondary | ICD-10-CM

## 2019-06-19 DIAGNOSIS — Z7189 Other specified counseling: Secondary | ICD-10-CM

## 2019-06-19 DIAGNOSIS — I2510 Atherosclerotic heart disease of native coronary artery without angina pectoris: Secondary | ICD-10-CM | POA: Diagnosis not present

## 2019-06-19 DIAGNOSIS — E785 Hyperlipidemia, unspecified: Secondary | ICD-10-CM

## 2019-06-19 DIAGNOSIS — I10 Essential (primary) hypertension: Secondary | ICD-10-CM | POA: Diagnosis not present

## 2019-06-19 DIAGNOSIS — R000 Tachycardia, unspecified: Secondary | ICD-10-CM

## 2019-06-19 MED ORDER — CARVEDILOL 12.5 MG PO TABS: 12.5000 mg | ORAL_TABLET | Freq: Two times a day (BID) | ORAL | 3 refills | Status: DC

## 2019-06-19 NOTE — Progress Notes (Signed)
Virtual Visit via Telephone Note   This visit type was conducted due to national recommendations for restrictions regarding the COVID-19 Pandemic (e.g. social distancing) in an effort to limit this patient's exposure and mitigate transmission in our community.  Due to her co-morbid illnesses, this patient is at least at moderate risk for complications without adequate follow up.  This format is felt to be most appropriate for this patient at this time.  The patient did not have access to video technology/had technical difficulties with video requiring transitioning to audio format only (telephone).  All issues noted in this document were discussed and addressed.  No physical exam could be performed with this format.  Please refer to the patient's chart for her  consent to telehealth for [facility].   Date:  06/19/2019   ID:  Amy Stevenson, DOB 08/26/1933, MRN [id number]  Patient Location: Home Provider Location: Home  PCP:  System, Pcp Not In  Cardiologist:  Kristeen MissPhilip Freddi Schrager, MD  Electrophysiologist:  None   Problem list 1.  Congestive heart failure 2.  Essential hypertension 3.  Hyperlipidemia 4.  CAD :   S/p stenting of the LCx  - 2006       Chief Complaint  Patient presents with   Congestive Heart Failure   Hyperlipidemia     Pt seen with Daughter, Dennie Bibleat.  Amy Stevenson is a 85 y.o. female with a hx of hypertension, hyperlipidemia with a recent diagnosis of congestive heart failure.  We are asked to see her today by Jolee EwingVanessa Hairston, FNT ( [place]) for further evaluation of her congestive heart failure. Previous pat of Dr. Reyes IvanKersey She had a myocardial infarction in 2006 and had stenting of her left circumflex artery by Dr. [place].  She has had some progressive shortness of breath for the past couple months.  She was in the hospital the week before Thanksgiving up in [place].  She was diuresed and felt much better.  She had an echocardiogram during that  hospitalization.  We do not have the results of that echocardiogram   She was scheduled to have a stress test.  She did not feel up to the stress test and wanted to wait and see me before she reschedule the stress test.  Since the hospitalization she has had some heart flutters. She is sleeping better ( was having PND and orthopnea ) , ankle swelling is better  Has not had any chest pain  Does not get any exercise.   Is able to do her housework .  Lives by herself. Does her own shopping.     Does have some DOE with bringing in the groceries.   March 08, 2018:  Amy Stevenson is seen today for follow-up of her hypertension, hyperlipidemia and congestive heart failure. She was seen in March and was reported having some increased shortness of breath.  Myoview study was done which revealed an ejection fraction of 54%.  She did not have any evidence of ischemia.  Has been under lots of stress  - sister died several months ago and this am found out that her sister in law had passed.  BP is elevated today .  Still using salt regularly   Continues to have some left sided chest pain ( normal myoview last month)  Thinks it may be gas.   June 04, 2018:  Amy Stevenson is seen today for follow-up of her chronic diastolic congestive heart failure, hypertension and hyperlipidemia.  She has a remote history of coronary artery  disease with stenting of the left circumflex artery in 2006.    Is taking her meds.    Still eats salt frequently  Ran out of her Chlorthalidone and lasix last week    Evaluation Performed:  Follow-Up Visit  Chief Complaint:  CHF, CAD,  Hyperlipidemia    June 19, 2019    Amy Stevenson is a 85 y.o. female with hx of CAD, HTN,  Hyperlipidemia VS look great  Still uses some salt, not much  Last lipids in Feb. 2020 look good  Walking better,  Doing better . Does not exercise per se ,  Walks inside   The patient does not have symptoms concerning for COVID-19 infection (fever,  chills, cough, or new shortness of breath).    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   chlorthalidone (HYGROTON) 25 MG tablet Take 1 tablet (25 mg total) by mouth daily.   furosemide (LASIX) 20 MG tablet TAKE 1 TAB BY MOUTH 3 TIMES PER WEEK (MONDAYS, WEDNESDAY AND FRIDAY)   levothyroxine (SYNTHROID, LEVOTHROID) 25 MCG tablet Take 25 mcg by mouth daily.   nitroGLYCERIN (NITROSTAT) 0.4 MG SL tablet Place 0.4 mg under the tongue every 5 (five) minutes as needed.   rosuvastatin (CRESTOR) 20 MG tablet Take 1 tablet (20 mg total) by mouth daily.   [DISCONTINUED] carvedilol (COREG) 25 MG tablet Take 25 mg by mouth daily.     Allergies:   Patient has no known allergies.   Social History   Tobacco Use   Smoking status: Never Smoker   Smokeless tobacco: Never Used  Substance Use Topics   Alcohol use: No   Drug use: No     Family Hx: The patient's family history includes Cancer in her mother and another family member; Cancer - Lung in her father; Diabetes in her brother; Stroke in an other family member.  ROS:   Please see the history of present illness.     All other systems reviewed and are negative.   Prior CV studies:   The following studies were reviewed today:    Labs/Other Tests and Data Reviewed:    EKG:  No ECG reviewed.  Recent Labs: 12/05/2018: BUN 19; Creatinine, Ser 1.05; Potassium 3.9; Sodium 140 01/16/2019: ALT 13   Recent Lipid Panel Lab Results  Component Value Date/Time   CHOL 156 01/16/2019 10:14 AM   TRIG 88 01/16/2019 10:14 AM   HDL 57 01/16/2019 10:14 AM   CHOLHDL 2.7 01/16/2019 10:14 AM   LDLCALC 81  01/16/2019 10:14 AM    Wt Readings from Last 3 Encounters:  06/19/19 156 lb (70.8 kg)  12/05/18 153 lb 6.4 oz (69.6 kg)  06/04/18 159 lb (72.1 kg)     Objective:    Vital Signs:  BP 129/63    Pulse 67    Ht 5' (1.524 m)    Wt 156 lb (70.8 kg)    BMI 30.47 kg/m      ASSESSMENT & PLAN:    1. 1.  Coronary artery disease: Patient is not having any  episodes of angina.  Continue current medications.  2.  Chronic diastolic congestive heart failure: She is avoiding salt and she has been taking her medications.  She is not having any signs or symptoms of congestive heart failure  3.  Hyperlipidemia: She is now on rosuvastatin.  Her last lipid level in February, 2028 looked good.  Continue current medications.  4.  Hypertension: Blood pressure is well controlled today.  Continue current medications.  She is taking carvedilol 25 mg once a day.  We will change her dosing to carvedilol 12.5 mg twice a day.  I will see her in 6 months.  COVID-19 Education: The signs and symptoms of COVID-19 were discussed with the patient and how to seek care for testing (follow up with PCP or arrange E-visit).  The importance of social distancing was discussed today.  Time:   Today, I have spent  16 minutes with the patient with telehealth technology discussing the above problems.     Medication Adjustments/Labs and Tests Ordered: Current medicines are reviewed at length with the patient today.  Concerns regarding medicines are outlined above.   Tests Ordered: No orders of the defined types were placed in this encounter.    Medication Changes: Meds ordered this encounter  Medications   carvedilol (COREG) 12.5 MG tablet    Sig: Take 1 tablet (12.5 mg total) by mouth 2 (two) times daily.    Dispense:  180 tablet    Refill:  3    Follow Up:  Virtual Visit or In Person in 6 month(s)  Signed, Kristeen MissPhilip Morris Markham, MD  06/19/2019 10:40 AM    [facility]

## 2019-06-19 NOTE — Patient Instructions (Signed)
Medication Instructions:   Your physician has recommended you make the following change in your medication:  Stop Carvedilol 25MG   Start Carvedilol 12.5MG  1 tablet by mouth twice a day  If you need a refill on your cardiac medications before your next appointment, please call your pharmacy.   Lab work:  None ordered today  If you have labs (blood work) drawn today and your tests are completely normal, you will receive your results only by: Marland Kitchen MyChart Message (if you have MyChart) OR . A paper copy in the mail If you have any lab test that is abnormal or we need to change your treatment, we will call you to review the results.  Testing/Procedures:  None ordered today  Follow-Up: At [facility], you and your health needs are our priority.  As part of our continuing mission to provide you with exceptional heart care, we have created designated Provider Care Teams.  These Care Teams include your primary Cardiologist (physician) and Advanced Practice Providers (APPs -  Physician Assistants and Nurse Practitioners) who all work together to provide you with the care you need, when you need it. You will need a follow up appointment in:  6 months.  Please call our office 2 months in advance to schedule this appointment.  You may see Mertie Moores, MD or one of the following Advanced Practice Providers on your designated Care Team: Richardson Dopp, PA-C [place], [place] . Daune Perch, NP

## 2019-08-06 ENCOUNTER — Other Ambulatory Visit: Payer: Self-pay | Admitting: Cardiovascular Disease

## 2019-12-22 ENCOUNTER — Encounter: Payer: Self-pay | Admitting: Cardiovascular Disease

## 2019-12-22 ENCOUNTER — Other Ambulatory Visit: Payer: Self-pay

## 2019-12-22 ENCOUNTER — Ambulatory Visit (INDEPENDENT_AMBULATORY_CARE_PROVIDER_SITE_OTHER): Payer: Medicare HMO | Admitting: Cardiovascular Disease

## 2019-12-22 VITALS — BP 130/68 | HR 60 | Ht 60.0 in | Wt 155.8 lb

## 2019-12-22 DIAGNOSIS — I2510 Atherosclerotic heart disease of native coronary artery without angina pectoris: Secondary | ICD-10-CM | POA: Diagnosis not present

## 2019-12-22 DIAGNOSIS — E782 Mixed hyperlipidemia: Secondary | ICD-10-CM | POA: Diagnosis not present

## 2019-12-22 DIAGNOSIS — I10 Essential (primary) hypertension: Secondary | ICD-10-CM | POA: Diagnosis not present

## 2019-12-22 DIAGNOSIS — I5032 Chronic diastolic (congestive) heart failure: Secondary | ICD-10-CM | POA: Diagnosis not present

## 2019-12-22 MED ORDER — AMLODIPINE BESYLATE 5 MG PO TABS: 5.0000 mg | ORAL_TABLET | Freq: Every day | ORAL | 3 refills | Status: DC

## 2019-12-22 NOTE — Patient Instructions (Signed)
Medication Instructions:  Your physician recommends that you continue on your current medications as directed. Please refer to the Current Medication list given to you today.  *If you need a refill on your cardiac medications before your next appointment, please call your pharmacy*  Lab Work: Your physician recommends that you return for lab work in: 6 months on the day of or a few days before your office visit.  You will need to FAST for this appointment - nothing to eat or drink after midnight the night before except water.  If you have labs (blood work) drawn today and your tests are completely normal, you will receive your results only by: Marland Kitchen MyChart Message (if you have MyChart) OR . A paper copy in the mail If you have any lab test that is abnormal or we need to change your treatment, we will call you to review the results.   Testing/Procedures: None Ordered   Follow-Up: At [facility], you and your health needs are our priority.  As part of our continuing mission to provide you with exceptional heart care, we have created designated Provider Care Teams.  These Care Teams include your primary Cardiologist (physician) and Advanced Practice Providers (APPs -  Physician Assistants and Nurse Practitioners) who all work together to provide you with the care you need, when you need it.  Your next appointment:   6 month(s)  The format for your next appointment:   Either In Person or Virtual  Provider:   Tereso Newcomer, PA-C, Chelsea Aus, PA-C or Berton Bon, NP

## 2019-12-22 NOTE — Progress Notes (Signed)
Cardiology Office Note:    Date:  12/22/2019   ID:  Amy Stevenson, DOB 07-02-1933, MRN [id number]  PCP:  System, Pcp Not In  Cardiologist:  Kristeen Miss, MD    Referring MD: No ref. provider found   Problem list 1.  Congestive heart failure 2.  Essential hypertension 3.  Hyperlipidemia 4.  CAD :   S/p stenting of the LCx  - 2006    Chief Complaint  Patient presents with  . Congestive Heart Failure  . Hyperlipidemia     Pt seen with Daughter, [name].  Amy Stevenson is a 86 y.o. female with a hx of hypertension, hyperlipidemia with a recent diagnosis of congestive heart failure.  We are asked to see her today by [name], FNT ( [place]) for further evaluation of her congestive heart failure. Previous pat of Dr. Reyes Ivan She had a myocardial infarction in 2006 and had stenting of her left circumflex artery by Dr. [place]il[place].  She has had some progressive shortness of breath for the past couple months.  She was in the hospital the week before Thanksgiving up in [place].  She was diuresed and felt much better.  She had an echocardiogram during that hospitalization.  We do not have the results of that echocardiogram   She was scheduled to have a stress test.  She did not feel up to the stress test and wanted to wait and see me before she reschedule the stress test.  Since the hospitalization she has had some heart flutters. She is sleeping better ( was having PND and orthopnea ) , ankle swelling is better  Has not had any chest pain  Does not get any exercise.   Is able to do her housework .  Lives by herself. Does her own shopping.     Does have some DOE with bringing in the groceries.   March 08, 2018:  Amy Stevenson is seen today for follow-up of her hypertension, hyperlipidemia and congestive heart failure. She was seen in March and was reported having some increased shortness of breath.  Myoview study was done which revealed an ejection fraction of 54%.  She did not have any  evidence of ischemia.  Has been under lots of stress  - sister died several months ago and this am found out that her sister in law had passed.  BP is elevated today .  Still using salt regularly   Continues to have some left sided chest pain ( normal myoview last month)  Thinks it may be gas.   June 04, 2018:  Amy Stevenson is seen today for follow-up of her chronic diastolic congestive heart failure, hypertension and hyperlipidemia.  She has a remote history of coronary artery disease with stenting of the left circumflex artery in 2006.    Is taking her meds.    Still eats salt frequently  Ran out of her Chlorthalidone and lasix last week   December 22, 2019:  Amy Stevenson is seen today for a follow-up visit regarding her hypertension, coronary artery disease, dyslipidemia. Walks around her house,   No cP , no dyspnea  BP is ok Uses a little salt  Admits that she does not take her rosuvastatin every day   Past Medical History:  Diagnosis Date  . Coronary artery disease   . Dyslipidemia   . Hypertension     Past Surgical History:  Procedure Laterality Date  . HYSTEROTOMY    . PTCA    . 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 aspirin 81 MG tablet Take 81 mg by mouth daily.  . calcitRIOL (ROCALTROL) 0.5 MCG capsule Take 0.5 mcg by mouth daily.  . carvedilol (COREG) 12.5 MG tablet Take 1 tablet (12.5 mg total) by mouth 2 (two) times daily.  . chlorthalidone (HYGROTON) 25 MG tablet Take 1 tablet by mouth once daily  . FEROSUL 325 (65 Fe) MG tablet Take 325 mg by mouth 2 (two) times daily.  . furosemide (LASIX) 20 MG tablet TAKE 1 TAB BY MOUTH 3 TIMES PER WEEK (MONDAYS, WEDNESDAY AND FRIDAY)  . levothyroxine (SYNTHROID, LEVOTHROID) 25 MCG tablet Take 25 mcg by mouth daily.  . nitroGLYCERIN (NITROSTAT) 0.4 MG SL tablet Place 0.4 mg under the tongue every 5 (five) minutes as needed.  . potassium chloride (MICRO-K) 10 MEQ CR capsule  Take 10 mEq by mouth daily.  . rosuvastatin (CRESTOR) 20 MG tablet Take 1 tablet (20 mg total) by mouth daily.  . [DISCONTINUED] amLODipine (NORVASC) 5 MG tablet Take 1 tablet by mouth once daily     Allergies:   Patient has no known allergies.   Social History   Socioeconomic History  . Marital status: Widowed    Spouse name: Not on file  . Number of children: 2  . Years of education: Not on file  . Highest education level: Not on file  Occupational History  . Occupation: retired  Tobacco Use  . Smoking status: Never Smoker  . Smokeless tobacco: Never Used  Substance and Sexual Activity  . Alcohol use: No  . Drug use: No  . Sexual activity: Not Currently  Other Topics Concern  . Not on file  Social History Narrative  . Not on file   Social Determinants of Health   Financial Resource Strain:   . Difficulty of Paying Living Expenses: Not on file  Food Insecurity:   . Worried About Programme researcher, broadcasting/film/video in the Last Year: Not on file  . Ran Out of Food in the Last Year: Not on file  Transportation Needs:   . Lack of Transportation (Medical): Not on file  . Lack of Transportation (Non-Medical): Not on file  Physical Activity:   . Days of Exercise per Week: Not on file  . Minutes of Exercise per Session: Not on file  Stress:   . Feeling of Stress : Not on file  Social Connections:   . Frequency of Communication with Friends and Family: Not on file  . Frequency of Social Gatherings with Friends and Family: Not on file  . Attends Religious Services: Not on file  . Active Member of Clubs or Organizations: Not on file  . Attends Banker Meetings: Not on file  . Marital Status: Not on file     Family History: The patient's family history includes Cancer in her mother and another family member; Cancer - Lung in her father; Diabetes in her brother; Stroke in an other family member. ROS:   Please see the history of present illness.     All other systems reviewed  and are negative.  EKGs/Labs/Other Studies Reviewed:    The following studies were reviewed today:   Recent Labs: 01/16/2019: ALT 13  Recent Lipid Panel    Component Value Date/Time   CHOL 156 01/16/2019 1014   TRIG 88 01/16/2019 1014   HDL 57 01/16/2019 1014   CHOLHDL 2.7 01/16/2019 1014   LDLCALC 81 01/16/2019 1014  Physical Exam: Blood pressure 130/68, pulse 60, height 5' (1.524 m), weight 155 lb 12.8 oz (70.7 kg), SpO2 99 %.  GEN:  Elderly , frail female,  NAD  HEENT: Normal NECK: No JVD; No carotid bruits LYMPHATICS: No lymphadenopathy CARDIAC: RRR ,  Aft systolic murmur  RESPIRATORY:  Clear to auscultation without rales, wheezing or rhonchi  ABDOMEN: Soft, non-tender, non-distended MUSCULOSKELETAL:  No edema; No deformity  SKIN: Warm and dry NEUROLOGIC:  Alert and oriented x 3  EKG: December 22, 2019: Normal sinus rhythm at 60.  Nonspecific ST and T wave abnormalities.  ASSESSMENT:    1. Essential hypertension   2. Mixed hyperlipidemia   3. Coronary artery disease involving native coronary artery of native heart without angina pectoris   4. Chronic diastolic heart failure (HCC)    PLAN:    In order of problems listed above:  1.  Acute on chronic congestive heart failure.      Stable .    2.  Coronary artery disease:  No angina ,   2.  Hyperlipidemia:  Does not take her crestor as directed.   Encouraged her to take her Crestor as directed.  We will recheck lipids, liver enzymes, basic metabolic profile in 6 months.  She will follow-up with an APP in 6 months.  3.  Essential hypertension:     Blood pressure looks good.  Medication Adjustments/Labs and Tests Ordered: Current medicines are reviewed at length with the patient today.  Concerns regarding medicines are outlined above.  Orders Placed This Encounter  Procedures  . Lipid Profile  . Basic Metabolic Panel (BMET)  . Hepatic function panel  . EKG 12-Lead   Meds ordered this encounter   Medications  . amLODipine (NORVASC) 5 MG tablet    Sig: Take 1 tablet (5 mg total) by mouth daily.    Dispense:  90 tablet    Refill:  3    Signed, Mertie Moores, MD  12/22/2019 4:13 PM    [facility]

## 2020-06-21 ENCOUNTER — Other Ambulatory Visit: Payer: Self-pay

## 2020-06-21 ENCOUNTER — Ambulatory Visit (INDEPENDENT_AMBULATORY_CARE_PROVIDER_SITE_OTHER): Payer: Medicare HMO | Admitting: Cardiovascular Disease

## 2020-06-21 ENCOUNTER — Encounter: Payer: Self-pay | Admitting: Cardiovascular Disease

## 2020-06-21 VITALS — BP 130/60 | HR 68 | Ht 60.0 in | Wt 162.8 lb

## 2020-06-21 DIAGNOSIS — I10 Essential (primary) hypertension: Secondary | ICD-10-CM

## 2020-06-21 DIAGNOSIS — R000 Tachycardia, unspecified: Secondary | ICD-10-CM

## 2020-06-21 DIAGNOSIS — E782 Mixed hyperlipidemia: Secondary | ICD-10-CM

## 2020-06-21 DIAGNOSIS — I2510 Atherosclerotic heart disease of native coronary artery without angina pectoris: Secondary | ICD-10-CM | POA: Diagnosis not present

## 2020-06-21 DIAGNOSIS — I5032 Chronic diastolic (congestive) heart failure: Secondary | ICD-10-CM

## 2020-06-21 NOTE — Progress Notes (Signed)
Cardiology Office Note:    Date:  06/21/2020   ID:  Amy Stevenson, DOB Jan 24, 1933, MRN [id number]  PCP:  System, Pcp Not In  Cardiologist:  Kristeen Miss, MD    Referring MD: No ref. provider found   Problem list 1.  Congestive heart failure 2.  Essential hypertension 3.  Hyperlipidemia 4.  CAD :   S/p stenting of the LCx  - 2006    No chief complaint on file.    Pt seen with Daughter, [name].  Amy Stevenson is a 86 y.o. female with a hx of hypertension, hyperlipidemia with a recent diagnosis of congestive heart failure.  We are asked to see her today by [name], FNT ( [place]) for further evaluation of her congestive heart failure. Previous pat of Dr. Reyes Ivan She had a myocardial infarction in 2006 and had stenting of her left circumflex artery by Dr. [place].  She has had some progressive shortness of breath for the past couple months.  She was in the hospital the week before Thanksgiving up in [place].  She was diuresed and felt much better.  She had an echocardiogram during that hospitalization.  We do not have the results of that echocardiogram   She was scheduled to have a stress test.  She did not feel up to the stress test and wanted to wait and see me before she reschedule the stress test.  Since the hospitalization she has had some heart flutters. She is sleeping better ( was having PND and orthopnea ) , ankle swelling is better  Has not had any chest pain  Does not get any exercise.   Is able to do her housework .  Lives by herself. Does her own shopping.     Does have some DOE with bringing in the groceries.   March 08, 2018:  Amy Stevenson is seen today for follow-up of her hypertension, hyperlipidemia and congestive heart failure. She was seen in March and was reported having some increased shortness of breath.  Myoview study was done which revealed an ejection fraction of 54%.  She did not have any evidence of ischemia.  Has been under lots of stress  - sister  died several months ago and this am found out that her sister in law had passed.  BP is elevated today .  Still using salt regularly   Continues to have some left sided chest pain ( normal myoview last month)  Thinks it may be gas.   June 04, 2018:  Amy Stevenson is seen today for follow-up of her chronic diastolic congestive heart failure, hypertension and hyperlipidemia.  She has a remote history of coronary artery disease with stenting of the left circumflex artery in 2006.    Is taking her meds.    Still eats salt frequently  Ran out of her Chlorthalidone and lasix last week   December 22, 2019:  Amy Stevenson is seen today for a follow-up visit regarding her hypertension, coronary artery disease, dyslipidemia. Walks around her house,   No cP , no dyspnea  BP is ok Uses a little salt  Admits that she does not take her rosuvastatin every day   June 21, 2020:  Feels ok Able to do her normal activities at home.  Some exercise.  Sleeps well .  Has a primary MD near [place], [place]  She comes from a family of 14 children.   She has lost all of her siblings except 1  [place].  Past Medical History:  Diagnosis Date  . Coronary artery disease   . Dyslipidemia   . Hypertension     Past Surgical History:  Procedure Laterality Date  . HYSTEROTOMY    . PTCA    . 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 amLODipine (NORVASC) 5 MG tablet Take 1 tablet (5 mg total) by mouth daily.  Marland Kitchen aspirin 81 MG tablet Take 81 mg by mouth daily.  . calcitRIOL (ROCALTROL) 0.5 MCG capsule Take 0.5 mcg by mouth daily.  . carvedilol (COREG) 12.5 MG tablet Take 1 tablet (12.5 mg total) by mouth 2 (two) times daily.  . chlorthalidone (HYGROTON) 25 MG tablet Take 1 tablet by mouth once daily  . Ergocalciferol (VITAMIN D2 PO) Take 1 tablet by mouth daily.  . FEROSUL 325 (65 Fe) MG tablet Take 325 mg by mouth 2 (two) times daily.  Marland Kitchen  levothyroxine (SYNTHROID, LEVOTHROID) 25 MCG tablet Take 25 mcg by mouth daily.  . nitroGLYCERIN (NITROSTAT) 0.4 MG SL tablet Place 0.4 mg under the tongue every 5 (five) minutes as needed.  . rosuvastatin (CRESTOR) 20 MG tablet Take 1 tablet (20 mg total) by mouth daily.     Allergies:   Patient has no known allergies.   Social History   Socioeconomic History  . Marital status: Widowed    Spouse name: Not on file  . Number of children: 2  . Years of education: Not on file  . Highest education level: Not on file  Occupational History  . Occupation: retired  Tobacco Use  . Smoking status: Never Smoker  . Smokeless tobacco: Never Used  Vaping Use  . Vaping Use: Never used  Substance and Sexual Activity  . Alcohol use: No  . Drug use: No  . Sexual activity: Not Currently  Other Topics Concern  . Not on file  Social History Narrative  . Not on file   Social Determinants of Health   Financial Resource Strain:   . Difficulty of Paying Living Expenses:   Food Insecurity:   . Worried About Programme researcher, broadcasting/film/video in the Last Year:   . Barista in the Last Year:   Transportation Needs:   . Freight forwarder (Medical):   Marland Kitchen Lack of Transportation (Non-Medical):   Physical Activity:   . Days of Exercise per Week:   . Minutes of Exercise per Session:   Stress:   . Feeling of Stress :   Social Connections:   . Frequency of Communication with Friends and Family:   . Frequency of Social Gatherings with Friends and Family:   . Attends Religious Services:   . Active Member of Clubs or Organizations:   . Attends Banker Meetings:   Marland Kitchen Marital Status:      Family History: The patient's family history includes Cancer in her mother and another family member; Cancer - Lung in her father; Diabetes in her brother; Stroke in an other family member. ROS:   Please see the history of present illness.     All other systems reviewed and are negative.  EKGs/Labs/Other  Studies Reviewed:    The following studies were reviewed today:   Recent Labs: No results found for requested labs within last 8760 hours.  Recent Lipid Panel    Component Value Date/Time   CHOL 156 01/16/2019 1014   TRIG 88 01/16/2019 1014   HDL 57 01/16/2019 1014  CHOLHDL 2.7 01/16/2019 1014   LDLCALC 81 01/16/2019 1014    Physical Exam: Blood pressure 130/60, pulse 68, height 5' (1.524 m), weight 162 lb 12.8 oz (73.8 kg), SpO2 99 %.  GEN:  Elderly female,  NAD  HEENT: Normal NECK: No JVD; No carotid bruits LYMPHATICS: No lymphadenopathy CARDIAC: RRR , no murmurs, rubs, gallops RESPIRATORY:  Clear to auscultation without rales, wheezing or rhonchi  ABDOMEN: Soft, non-tender, non-distended MUSCULOSKELETAL:  No edema; No deformity  SKIN: Warm and dry NEUROLOGIC:  Alert and oriented x 3   EKG:    ASSESSMENT:    1. Mixed hyperlipidemia   2. Essential hypertension   3. Coronary artery disease involving native coronary artery of native heart without angina pectoris   4. Chronic diastolic heart failure (HCC)   5. Sinus tachycardia    PLAN:    In order of problems listed above:  1.  Acute on chronic congestive heart failure.     Seems to be doing well.  Continue current meds.        2.  Coronary artery disease:   No angina   2.  Hyperlipidemia:   Check lipids, liver, bmp today   3.  Essential hypertension:     BP looks good.   Medication Adjustments/Labs and Tests Ordered: Current medicines are reviewed at length with the patient today.  Concerns regarding medicines are outlined above.  Orders Placed This Encounter  Procedures  . CBC  . Basic metabolic panel  . Lipid panel  . Hepatic function panel   No orders of the defined types were placed in this encounter.   Signed, Kristeen Miss, MD  06/21/2020 2:11 PM    [facility]

## 2020-06-21 NOTE — Patient Instructions (Signed)
Medication Instructions:  .Your physician recommends that you continue on your current medications as directed. Please refer to the Current Medication list given to you today.  *If you need a refill on your cardiac medications before your next appointment, please call your pharmacy*   Lab Work: You will have a BMET, lipid, liver and CBC today. If you have labs (blood work) drawn today and your tests are completely normal, you will receive your results only by: Marland Kitchen MyChart Message (if you have MyChart) OR . A paper copy in the mail If you have any lab test that is abnormal or we need to change your treatment, we will call you to review the results.   Testing/Procedures: None ordered   Follow-Up: At [facility], you and your health needs are our priority.  As part of our continuing mission to provide you with exceptional heart care, we have created designated Provider Care Teams.  These Care Teams include your primary Cardiologist (physician) and Advanced Practice Providers (APPs -  Physician Assistants and Nurse Practitioners) who all work together to provide you with the care you need, when you need it.    Your next appointment:   6 month(s)  The format for your next appointment:   In Person  Provider:   You may see Kristeen Miss, MD or one of the following Advanced Practice Providers on your designated Care Team:    Tereso Newcomer, PA-C  [place], [place]

## 2020-06-22 LAB — CBC
Hematocrit: 35.3 % (ref 34.0–46.6)
Hemoglobin: 10.8 g/dL — ABNORMAL LOW (ref 11.1–15.9)
MCH: 24.3 pg — ABNORMAL LOW (ref 26.6–33.0)
MCHC: 30.6 g/dL — ABNORMAL LOW (ref 31.5–35.7)
MCV: 80 fL (ref 79–97)
Platelets: 318 10*3/uL (ref 150–450)
RBC: 4.44 x10E6/uL (ref 3.77–5.28)
RDW: 15.6 % — ABNORMAL HIGH (ref 11.7–15.4)
WBC: 7.8 10*3/uL (ref 3.4–10.8)

## 2020-06-22 LAB — BASIC METABOLIC PANEL
BUN/Creatinine Ratio: 18 (ref 12–28)
BUN: 17 mg/dL (ref 8–27)
CO2: 29 mmol/L (ref 20–29)
Calcium: 9.7 mg/dL (ref 8.7–10.3)
Chloride: 99 mmol/L (ref 96–106)
Creatinine, Ser: 0.92 mg/dL (ref 0.57–1.00)
GFR calc Af Amer: 66 mL/min/{1.73_m2} (ref >59)
GFR calc non Af Amer: 57 mL/min/{1.73_m2} — ABNORMAL LOW (ref >59)
Glucose: 85 mg/dL (ref 65–99)
Potassium: 3.8 mmol/L (ref 3.5–5.2)
Sodium: 136 mmol/L (ref 134–144)

## 2020-06-22 LAB — LIPID PANEL
Chol/HDL Ratio: 4.8 ratio — ABNORMAL HIGH (ref 0.0–4.4)
Cholesterol, Total: 260 mg/dL — ABNORMAL HIGH (ref 100–199)
HDL: 54 mg/dL (ref >39)
LDL Chol Calc (NIH): 190 mg/dL — ABNORMAL HIGH (ref 0–99)
Triglycerides: 93 mg/dL (ref 0–149)
VLDL Cholesterol Cal: 16 mg/dL (ref 5–40)

## 2020-06-22 LAB — HEPATIC FUNCTION PANEL
ALT: 24 IU/L (ref 0–32)
AST: 22 IU/L (ref 0–40)
Albumin: 4.1 g/dL (ref 3.6–4.6)
Alkaline Phosphatase: 106 IU/L (ref 48–121)
Bilirubin Total: 0.2 mg/dL (ref 0.0–1.2)
Bilirubin, Direct: 0.09 mg/dL (ref 0.00–0.40)
Total Protein: 7.5 g/dL (ref 6.0–8.5)

## 2020-09-09 ENCOUNTER — Other Ambulatory Visit: Payer: Self-pay | Admitting: Cardiovascular Disease

## 2020-09-13 ENCOUNTER — Other Ambulatory Visit: Payer: Self-pay

## 2020-09-13 MED ORDER — CARVEDILOL 12.5 MG PO TABS: 12.5000 mg | ORAL_TABLET | Freq: Two times a day (BID) | ORAL | 2 refills | Status: DC

## 2020-12-24 ENCOUNTER — Ambulatory Visit: Payer: Medicare HMO | Admitting: Cardiovascular Disease

## 2020-12-31 ENCOUNTER — Other Ambulatory Visit: Payer: Self-pay

## 2020-12-31 ENCOUNTER — Encounter: Payer: Self-pay | Admitting: Cardiovascular Disease

## 2020-12-31 ENCOUNTER — Ambulatory Visit (INDEPENDENT_AMBULATORY_CARE_PROVIDER_SITE_OTHER): Payer: Medicare HMO | Admitting: Cardiovascular Disease

## 2020-12-31 VITALS — BP 114/72 | HR 69 | Ht 60.0 in | Wt 171.2 lb

## 2020-12-31 DIAGNOSIS — I447 Left bundle-branch block, unspecified: Secondary | ICD-10-CM | POA: Diagnosis not present

## 2020-12-31 DIAGNOSIS — I10 Essential (primary) hypertension: Secondary | ICD-10-CM | POA: Diagnosis not present

## 2020-12-31 DIAGNOSIS — I2510 Atherosclerotic heart disease of native coronary artery without angina pectoris: Secondary | ICD-10-CM | POA: Diagnosis not present

## 2020-12-31 MED ORDER — ROSUVASTATIN CALCIUM 20 MG PO TABS: 20.0000 mg | ORAL_TABLET | Freq: Every day | ORAL | 3 refills | Status: DC

## 2020-12-31 MED ORDER — POTASSIUM CHLORIDE ER 10 MEQ PO TBCR: 10.0000 meq | EXTENDED_RELEASE_TABLET | Freq: Every day | ORAL | 3 refills | Status: DC

## 2020-12-31 NOTE — Progress Notes (Signed)
Cardiology Office Note:    Date:  12/31/2020   ID:  Amy Stevenson, DOB [birth year], MRN [id number]  PCP:  Pcp, No  Cardiologist:  Kristeen Miss, MD    Referring MD: No ref. provider found   Problem list 1.  Congestive heart failure 2.  Essential hypertension 3.  Hyperlipidemia 4.  CAD :   S/p stenting of the LCx  - 2006    Chief Complaint  Patient presents with  . Hypertension  . Hyperlipidemia    Previous Notes:   Pt seen with Daughter, [name].  Amy Stevenson is a 87 y.o. female with a hx of hypertension, hyperlipidemia with a recent diagnosis of congestive heart failure.  We are asked to see her today by [name], FNT ( [place]) for further evaluation of her congestive heart failure. Previous pat of Amy Stevenson She had a myocardial infarction in 2006 and had stenting of her left circumflex artery by Dr. Swaziland.  She has had some progressive shortness of breath for the past couple months.  She was in the hospital the week before Thanksgiving up in [place].  She was diuresed and felt much better.  She had an echocardiogram during that hospitalization.  We do not have the results of that echocardiogram   She was scheduled to have a stress test.  She did not feel up to the stress test and wanted to wait and see me before she reschedule the stress test.  Since the hospitalization she has had some heart flutters. She is sleeping better ( was having PND and orthopnea ) , ankle swelling is better  Has not had any chest pain  Does not get any exercise.   Is able to do her housework .  Lives by herself. Does her own shopping.     Does have some DOE with bringing in the groceries.   March 08, 2018:  Brock is seen today for follow-up of her hypertension, hyperlipidemia and congestive heart failure. She was seen in March and was reported having some increased shortness of breath.  Myoview study was done which revealed an ejection fraction of 54%.  She did not have any evidence  of ischemia.  Has been under lots of stress  - sister died several months ago and this am found out that her sister in law had passed.  BP is elevated today .  Still using salt regularly   Continues to have some left sided chest pain ( normal myoview last month)  Thinks it may be gas.   June 04, 2018:  Amy Stevenson is seen today for follow-up of her chronic diastolic congestive heart failure, hypertension and hyperlipidemia.  She has a remote history of coronary artery disease with stenting of the left circumflex artery in 2006.    Is taking her meds.    Still eats salt frequently  Ran out of her Chlorthalidone and lasix last week   December 22, 2019:  Amy Stevenson is seen today for a follow-up visit regarding her hypertension, coronary artery disease, dyslipidemia. Walks around her house,   No cP , no dyspnea  BP is ok Uses a little salt  Admits that she does not take her rosuvastatin every day   June 21, 2020:  Feels ok Able to do her normal activities at home.  Some exercise.  Sleeps well .  Has a primary MD near [place], [place]  She comes from a family of 14 children.   She has lost all of her siblings except  1  She grew up [place] a farm.    Jan. 28, 2022:  Amy Stevenson is seen today for follow up of her HTN and HLD No cardiac issues.  No cp or dyspnea  Sees Dr. [name] in [place]  VS look good     Past Medical History:  Diagnosis Date  . Coronary artery disease   . Dyslipidemia   . Hypertension     Past Surgical History:  Procedure Laterality Date  . HYSTEROTOMY    . PTCA    . 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 amLODipine (NORVASC) 5 MG tablet Take 1 tablet (5 mg total) by mouth daily.  Marland Kitchen aspirin 81 MG tablet Take 81 mg by mouth daily.  . chlorthalidone (HYGROTON) 25 MG tablet Take 1 tablet by mouth once daily  . levothyroxine (SYNTHROID, LEVOTHROID) 25 MCG tablet Take 25 mcg by mouth daily.  .  nitroGLYCERIN (NITROSTAT) 0.4 MG SL tablet Place 0.4 mg under the tongue every 5 (five) minutes as needed.  . potassium chloride (KLOR-CON) 10 MEQ tablet Take 1 tablet (10 mEq total) by mouth daily.  . Vitamin D, Ergocalciferol, (DRISDOL) 1.25 MG (50000 UNIT) CAPS capsule Take 50,000 Units by mouth once a week.  . [DISCONTINUED] calcitRIOL (ROCALTROL) 0.5 MCG capsule Take 0.5 mcg by mouth daily.  . [DISCONTINUED] Ergocalciferol (VITAMIN D2 PO) Take 1 tablet by mouth daily.  . [DISCONTINUED] FEROSUL 325 (65 Fe) MG tablet Take 325 mg by mouth 2 (two) times daily.  . [DISCONTINUED] potassium chloride (MICRO-K) 10 MEQ CR capsule Take 10 mEq by mouth daily.  . [DISCONTINUED] rosuvastatin (CRESTOR) 20 MG tablet Take 1 tablet (20 mg total) by mouth daily.     Allergies:   Patient has no known allergies.   Social History   Socioeconomic History  . Marital status: Widowed    Spouse name: Not on file  . Number of children: 2  . Years of education: Not on file  . Highest education level: Not on file  Occupational History  . Occupation: retired  Tobacco Use  . Smoking status: Never Smoker  . Smokeless tobacco: Never Used  Vaping Use  . Vaping Use: Never used  Substance and Sexual Activity  . Alcohol use: No  . Drug use: No  . Sexual activity: Not Currently  Other Topics Concern  . Not on file  Social History Narrative  . Not on file   Social Determinants of Health   Financial Resource Strain: Not on file  Food Insecurity: Not on file  Transportation Needs: Not on file  Physical Activity: Not on file  Stress: Not on file  Social Connections: Not on file     Family History: The patient's family history includes Cancer in her mother and another family member; Cancer - Lung in her father; Diabetes in her brother; Stroke in an other family member. ROS:   Please see the history of present illness.     All other systems reviewed and are negative.  EKGs/Labs/Other Studies Reviewed:     The following studies were reviewed today:   Recent Labs: 06/21/2020: ALT 24; BUN 17; Creatinine, Ser 0.92; Hemoglobin 10.8; Platelets 318; Potassium 3.8; Sodium 136  Recent Lipid Panel    Component Value Date/Time   CHOL 260 (H) 06/21/2020 1404   TRIG 93 06/21/2020 1404   HDL 54 06/21/2020 1404   CHOLHDL 4.8 (H) 06/21/2020 1404   LDLCALC  190 (H) 06/21/2020 1404    Physical Exam: Blood pressure 114/72, pulse 69, height 5' (1.524 m), weight 171 lb 3.2 oz (77.7 kg), SpO2 98 %.  GEN:  Well nourished, well developed in no acute distress HEENT: Normal NECK: No JVD; No carotid bruits LYMPHATICS: No lymphadenopathy CARDIAC: RRR , no murmurs, rubs, gallops RESPIRATORY:  Clear to auscultation without rales, wheezing or rhonchi  ABDOMEN: Soft, non-tender, non-distended MUSCULOSKELETAL:  No edema; No deformity  SKIN: Warm and dry NEUROLOGIC:  Alert and oriented x 3   EKG:    Jan. 28, 2022:   NSR at 69.  LBBB   ASSESSMENT:    1. Essential hypertension    PLAN:       1.  Acute on chronic congestive heart failure.     Seems to be doing well.  Continue current meds.       No signs of symptoms of CHF   2.  Coronary artery disease:    Hx of stenting of her LCx.     2.  Hyperlipidemia:     She has not been taking her rosuvastatin regularly.   Will refill her rosuvastatin 20 mg a day .  Check lipids , liver enz, , bmp when I see her again in 6 months .   3.  Essential hypertension:     - BP is well controlled.   Medication Adjustments/Labs and Tests Ordered: Current medicines are reviewed at length with the patient today.  Concerns regarding medicines are outlined above.  Orders Placed This Encounter  Procedures  . EKG 12-Lead   Meds ordered this encounter  Medications  . potassium chloride (KLOR-CON) 10 MEQ tablet    Sig: Take 1 tablet (10 mEq total) by mouth daily.    Dispense:  90 tablet    Refill:  3  . rosuvastatin (CRESTOR) 20 MG tablet    Sig: Take 1 tablet (20 mg  total) by mouth daily.    Dispense:  90 tablet    Refill:  3      Signed, Kristeen Miss, MD  12/31/2020 12:30 PM    [facility]

## 2020-12-31 NOTE — Patient Instructions (Signed)
Medication Instructions:  Your physician recommends that you continue on your current medications as directed. Please refer to the Current Medication list given to you today.  *If you need a refill on your cardiac medications before your next appointment, please call your pharmacy*   Lab Work: None today If you have labs (blood work) drawn today and your tests are completely normal, you will receive your results only by: Marland Kitchen MyChart Message (if you have MyChart) OR . A paper copy in the mail If you have any lab test that is abnormal or we need to change your treatment, we will call you to review the results.   Testing/Procedures: None   Follow-Up: At [facility], you and your health needs are our priority.  As part of our continuing mission to provide you with exceptional heart care, we have created designated Provider Care Teams.  These Care Teams include your primary Cardiologist (physician) and Advanced Practice Providers (APPs -  Physician Assistants and Nurse Practitioners) who all work together to provide you with the care you need, when you need it.  We recommend signing up for the patient portal called "MyChart".  Sign up information is provided on this After Visit Summary.  MyChart is used to connect with patients for Virtual Visits (Telemedicine).  Patients are able to view lab/test results, encounter notes, upcoming appointments, etc.  Non-urgent messages can be sent to your provider as well.   To learn more about what you can do with MyChart, go to [email or web address].    Your next appointment:   6 month(s)  The format for your next appointment:   In Person  Provider:   Kristeen Miss, MD

## 2021-03-29 ENCOUNTER — Other Ambulatory Visit: Payer: Self-pay | Admitting: Cardiovascular Disease

## 2021-04-23 ENCOUNTER — Other Ambulatory Visit: Payer: Self-pay | Admitting: Cardiovascular Disease

## 2021-06-21 ENCOUNTER — Other Ambulatory Visit: Payer: Self-pay | Admitting: Cardiovascular Disease

## 2021-12-15 ENCOUNTER — Encounter: Payer: Self-pay | Admitting: Cardiovascular Disease

## 2021-12-15 NOTE — Progress Notes (Signed)
Cardiology Office Note:    Date:  12/16/2021   ID:  Amy Stevenson, DOB December 04, 1933, MRN [id number]  PCP:  Pcp, No  Cardiologist:  Kristeen Miss, MD    Referring MD: No ref. provider found   Problem list 1.  Congestive heart failure 2.  Essential hypertension 3.  Hyperlipidemia 4.  CAD :   S/p stenting of the LCx  - 2006    Chief Complaint  Patient presents with   Hypertension        Hyperlipidemia    Previous Notes:   Pt seen with Daughter, Amy Bible.  Amy Stevenson is a 88 y.o. female with a hx of hypertension, hyperlipidemia with a recent diagnosis of congestive heart failure.  We are asked to see her today by [name], FNT ( [place]) for further evaluation of her congestive heart failure. Previous Stevenson of Dr. Reyes Ivan She had a myocardial infarction in 2006 and had stenting of her left circumflex artery by Dr. Sw[place]iland.  She has had some progressive shortness of breath for the past couple months.  She was in the hospital the week before Thanksgiving up in [place].  She was diuresed and felt much better.  She had an echocardiogram during that hospitalization.  We do not have the results of that echocardiogram   She was scheduled to have a stress test.  She did not feel up to the stress test and wanted to wait and see me before she reschedule the stress test.  Since the hospitalization she has had some heart flutters. She is sleeping better ( was having PND and orthopnea ) , ankle swelling is better  Has not had any chest pain  Does not get any exercise.   Is able to do her housework .  Lives by herself. Does her own shopping.     Does have some DOE with bringing in the groceries.   March 08, 2018:  Amy Stevenson is seen today for follow-up of her hypertension, hyperlipidemia and congestive heart failure. She was seen in March and was reported having some increased shortness of breath.  Myoview study was done which revealed an ejection fraction of 54%.  She did not have any  evidence of ischemia.  Has been under lots of stress  - sister died several months ago and this am found out that her sister in law had passed.  BP is elevated today .  Still using salt regularly   Continues to have some left sided chest pain ( normal myoview last month)  Thinks it may be gas.   June 04, 2018:  Amy Stevenson is seen today for follow-up of her chronic diastolic congestive heart failure, hypertension and hyperlipidemia.  She has a remote history of coronary artery disease with stenting of the left circumflex artery in 2006.    Is taking her meds.    Still eats salt frequently  Ran out of her Chlorthalidone and lasix last week   December 22, 2019:  Amy Stevenson is seen today for a follow-up visit regarding her hypertension, coronary artery disease, dyslipidemia. Walks around her house,   No cP , no dyspnea  BP is ok Uses a little salt  Admits that she does not take her rosuvastatin every day   June 21, 2020:  Feels ok Able to do her normal activities at home.  Some exercise.  Sleeps well .  Has a primary MD near [place], [place]  She comes from a family of 14 children.   She has lost  all of her siblings except 1  She grew up on a farm.    Jan. 28, 2022:  Amy Stevenson is seen today for follow up of her HTN and HLD No cardiac issues.  No cp or dyspnea  Sees Dr. [name] in [place]  VS look good     Jan. 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   carvedilol (COREG) 12.5 MG tablet Take 1 tablet by mouth twice daily   chlorthalidone (HYGROTON) 25 MG tablet Take 1 tablet by mouth once daily   ferrous sulfate 325 (65 FE) MG EC tablet Take 1 tablet by mouth 2 (two) times daily.   levothyroxine (SYNTHROID, LEVOTHROID) 25 MCG tablet Take 25 mcg by mouth daily.   potassium chloride (KLOR-CON) 10 MEQ tablet Take 1 tablet (10 mEq total) by mouth daily.   rosuvastatin (CRESTOR) 20 MG tablet Take 1 tablet (20 mg total) by mouth daily.   Vitamin D, Ergocalciferol, (DRISDOL) 1.25 MG (50000 UNIT) CAPS capsule Take 50,000 Units by mouth once a week.   [DISCONTINUED] nitroGLYCERIN (NITROSTAT) 0.4 MG SL tablet Place 0.4 mg under the tongue every 5 (five) minutes as needed.     Allergies:   Patient has no known allergies.   Social History   Socioeconomic History   Marital status: Widowed    Spouse name: Not on file   Number of children: 2   Years of education: Not on file   Highest education level: Not on file  Occupational History   Occupation: retired  Tobacco Use   Smoking status: Never   Smokeless tobacco: Never  Vaping Use   Vaping Use: Never used  Substance and Sexual Activity   Alcohol use: No   Drug use: No   Sexual activity: Not Currently  Other Topics Concern   Not on file  Social History Narrative   Not on file   Social Determinants of Health   Financial Resource Strain: Not on file  Food Insecurity: Not on file  Transportation Needs: Not on file  Physical Activity: Not on file  Stress: Not on file  Social Connections: Not on file     Family History: The patient's family history includes Cancer in her mother and another family member; Cancer - Lung in her father; Diabetes in her brother; Stroke in an other family member. ROS:   Please see the history of present illness.     All other systems reviewed and are negative.  EKGs/Labs/Other  Studies Reviewed:    The following studies were reviewed today:   Recent Labs: No results found for requested labs within last 8760 hours.  Recent Lipid Panel    Component Value Date/Time   CHOL 260 (H) 06/21/2020 1404  TRIG 93 06/21/2020 1404   HDL 54 06/21/2020 1404   CHOLHDL 4.8 (H) 06/21/2020 1404   LDLCALC 190 (H) 06/21/2020 1404     Physical Exam: Blood pressure 128/72, pulse 69, height 5' (1.524 m), weight 167 lb 3.2 oz (75.8 kg), SpO2 99 %.  GEN:  elderly female,  no acute distress HEENT: Normal NECK: No JVD; No carotid bruits LYMPHATICS: No lymphadenopathy CARDIAC: RRR , no murmurs, rubs, gallops RESPIRATORY:  Clear to auscultation without rales, wheezing or rhonchi  ABDOMEN: Soft, non-tender, non-distended MUSCULOSKELETAL:  No edema; No deformity  SKIN: Warm and dry NEUROLOGIC:  Alert and oriented x 3   EKG:    December 16, 2021: Normal sinus rhythm at 69.  Left bundle branch block.  No changes from previous EKG.   ASSESSMENT:    1. Coronary artery disease involving native heart without angina pectoris, unspecified vessel or lesion type   2. Chronic diastolic heart failure (HCC)   3. Primary hypertension     PLAN:       1.  Acute on chronic congestive heart failure.   She is breathing well.  We do not have a recent echocardiogram.  Myoview study shows an EF of 54%.  She did have an echocardiogram while up in [place] several years ago.  At this point she is doing well and I do not see any need to repeat the echocardiogram at this time.    2.  Coronary artery disease:    Hx of stenting of her LCx.   We have refilled her nitroglycerin.  She is not having any episodes of angina.  2.  Hyperlipidemia:     Stable. She is in crestor   3.  Essential hypertension:     -  BP is well controlled.    Medication Adjustments/Labs and Tests Ordered: Current medicines are reviewed at length with the patient today.  Concerns regarding medicines are outlined above.   Orders Placed This Encounter  Procedures   EKG 12-Lead   Meds ordered this encounter  Medications   nitroGLYCERIN (NITROSTAT) 0.4 MG SL tablet    Sig: Place 1 tablet (0.4 mg total) under the tongue every 5 (five) minutes as needed.    Dispense:  25 tablet    Refill:  11      Signed, Kristeen MissPhilip Deem Marmol, MD  12/16/2021 11:37 AM    [facility]

## 2021-12-16 ENCOUNTER — Ambulatory Visit (INDEPENDENT_AMBULATORY_CARE_PROVIDER_SITE_OTHER): Payer: Medicare HMO | Admitting: Cardiovascular Disease

## 2021-12-16 ENCOUNTER — Encounter: Payer: Self-pay | Admitting: Cardiovascular Disease

## 2021-12-16 ENCOUNTER — Other Ambulatory Visit: Payer: Self-pay

## 2021-12-16 ENCOUNTER — Other Ambulatory Visit: Payer: Medicare HMO | Admitting: *Deleted

## 2021-12-16 VITALS — BP 128/72 | HR 69 | Ht 60.0 in | Wt 167.2 lb

## 2021-12-16 DIAGNOSIS — I10 Essential (primary) hypertension: Secondary | ICD-10-CM | POA: Diagnosis not present

## 2021-12-16 DIAGNOSIS — E782 Mixed hyperlipidemia: Secondary | ICD-10-CM

## 2021-12-16 DIAGNOSIS — I2510 Atherosclerotic heart disease of native coronary artery without angina pectoris: Secondary | ICD-10-CM | POA: Diagnosis not present

## 2021-12-16 DIAGNOSIS — I5032 Chronic diastolic (congestive) heart failure: Secondary | ICD-10-CM | POA: Diagnosis not present

## 2021-12-16 LAB — HEPATIC FUNCTION PANEL
ALT: 15 IU/L (ref 0–32)
AST: 20 IU/L (ref 0–40)
Albumin: 4.1 g/dL (ref 3.6–4.6)
Alkaline Phosphatase: 89 IU/L (ref 44–121)
Bilirubin Total: 0.4 mg/dL (ref 0.0–1.2)
Bilirubin, Direct: <0.1 mg/dL (ref 0.00–0.40)
Total Protein: 7.3 g/dL (ref 6.0–8.5)

## 2021-12-16 LAB — BASIC METABOLIC PANEL
BUN/Creatinine Ratio: 18 (ref 12–28)
BUN: 17 mg/dL (ref 8–27)
CO2: 24 mmol/L (ref 20–29)
Calcium: 9.7 mg/dL (ref 8.7–10.3)
Chloride: 100 mmol/L (ref 96–106)
Creatinine, Ser: 0.96 mg/dL (ref 0.57–1.00)
Glucose: 88 mg/dL (ref 70–99)
Potassium: 3.4 mmol/L — ABNORMAL LOW (ref 3.5–5.2)
Sodium: 139 mmol/L (ref 134–144)
eGFR: 58 mL/min/{1.73_m2} — ABNORMAL LOW (ref >59)

## 2021-12-16 LAB — LIPID PANEL
Chol/HDL Ratio: 3 ratio (ref 0.0–4.4)
Cholesterol, Total: 169 mg/dL (ref 100–199)
HDL: 56 mg/dL (ref >39)
LDL Chol Calc (NIH): 100 mg/dL — ABNORMAL HIGH (ref 0–99)
Triglycerides: 65 mg/dL (ref 0–149)
VLDL Cholesterol Cal: 13 mg/dL (ref 5–40)

## 2021-12-16 MED ORDER — NITROGLYCERIN 0.4 MG SL SUBL: 0.4000 mg | SUBLINGUAL_TABLET | SUBLINGUAL | 11 refills | Status: DC | PRN

## 2021-12-16 NOTE — Patient Instructions (Signed)
Medication Instructions:  Your physician recommends that you continue on your current medications as directed. Please refer to the Current Medication list given to you today.  *If you need a refill on your cardiac medications before your next appointment, please call your pharmacy*  Lab Work: If you have labs (blood work) drawn today and your tests are completely normal, you will receive your results only by: MyChart Message (if you have MyChart) OR A paper copy in the mail If you have any lab test that is abnormal or we need to change your treatment, we will call you to review the results.  Testing/Procedures: None ordered today.  Follow-Up: At [facility], you and your health needs are our priority.  As part of our continuing mission to provide you with exceptional heart care, we have created designated Provider Care Teams.  These Care Teams include your primary Cardiologist (physician) and Advanced Practice Providers (APPs -  Physician Assistants and Nurse Practitioners) who all work together to provide you with the care you need, when you need it.  We recommend signing up for the patient portal called "MyChart".  Sign up information is provided on this After Visit Summary.  MyChart is used to connect with patients for Virtual Visits (Telemedicine).  Patients are able to view lab/test results, encounter notes, upcoming appointments, etc.  Non-urgent messages can be sent to your provider as well.   To learn more about what you can do with MyChart, go to [email or web address].    Your next appointment:   12 month(s)  The format for your next appointment:   In Person  Provider:   Eligha Bridegroom NP

## 2021-12-19 ENCOUNTER — Other Ambulatory Visit: Payer: Self-pay

## 2021-12-19 DIAGNOSIS — E876 Hypokalemia: Secondary | ICD-10-CM

## 2021-12-22 ENCOUNTER — Telehealth: Payer: Self-pay | Admitting: Cardiovascular Disease

## 2021-12-22 NOTE — Telephone Encounter (Signed)
Pt is wanting to know if she is needing to come in again for lab work as she has already had them drawn... please advise

## 2021-12-22 NOTE — Telephone Encounter (Signed)
Pt is calling to schedule her lab appt as advised by Dr. Elease Hashimoto on 12/19/21 (referenced below).  Pt will come in for her repeat BMET in one week on next Tuesday 12/27/21. Family member will bring her to this appt.  This is to follow-up on low K level and restarting KDUR, as advised below.  Pt aware of lab appt on 12/27/21. Pt verbalized understanding and agrees with this plan.       Lattie Haw, RN  12/19/2021 11:45 AM EST     Preliminary reviewed by nurse, awaiting provider signature Potassium slightly low at 3.4. Patient taking chlorthalidone 25 mg QD. Patient is supposed to be taking k-dur 10 mEq QD, however she states that she has not taken in over a week.  Instructed patient to take her potassium QD as prescribed and return for repeat BMET in 1 week. Patient states that she will reach out to a family member/friend to bring her and call back to schedule lab appointment.  BMET order has been entered.  Patient made aware that we will call her with additional any recommendations once the provider has reviewed her results. She verbalized understanding and thanked me for the call.

## 2021-12-27 ENCOUNTER — Other Ambulatory Visit: Payer: Self-pay

## 2021-12-27 ENCOUNTER — Other Ambulatory Visit: Payer: Medicare HMO | Admitting: *Deleted

## 2021-12-27 DIAGNOSIS — E876 Hypokalemia: Secondary | ICD-10-CM

## 2021-12-27 LAB — BASIC METABOLIC PANEL
BUN/Creatinine Ratio: 11 — ABNORMAL LOW (ref 12–28)
BUN: 10 mg/dL (ref 8–27)
CO2: 26 mmol/L (ref 20–29)
Calcium: 9.3 mg/dL (ref 8.7–10.3)
Chloride: 104 mmol/L (ref 96–106)
Creatinine, Ser: 0.94 mg/dL (ref 0.57–1.00)
Glucose: 85 mg/dL (ref 70–99)
Potassium: 4 mmol/L (ref 3.5–5.2)
Sodium: 141 mmol/L (ref 134–144)
eGFR: 59 mL/min/{1.73_m2} — ABNORMAL LOW (ref >59)

## 2022-07-31 ENCOUNTER — Other Ambulatory Visit: Payer: Self-pay | Admitting: Cardiovascular Disease

## 2022-12-10 NOTE — Progress Notes (Unsigned)
Cardiology Office Note:    Date:  12/11/2022   ID:  Curt Bears, DOB March 12, 1933, MRN [id number]  PCP:  Karma Ganja, NP   [facility] Providers Cardiologist:  Mertie Moores, MD     Referring MD: Karma Ganja, NP   Chief Complaint:   History of Present Illness:    Amy Stevenson is a pleasant 89 y.o. female with a hx of HTN, HLD, CAD s/p stenting of LCx 2006, chronic HFpEF, and LBBB.   History of myocardial infarction in 2006 with stenting of her left circumflex by Dr. Martinique. She is a previous patient of Dr. Verlon Setting.   Referred back to cardiology and seen by Dr. Acie Fredrickson 12/11/2017 for evaluation of CHF.  She had progressive shortness of breath for a few months prior to office visit and was hospitalized in [place]. She felt much better after diuresis. She had an echocardiogram during that hospitalization with results not available at the time of visit. Admitted to dietary indiscretion with sodium. Was encouraged to undergo a stress test during admission but wanted to be seen in the office prior to scheduling that. Had previously been having PND and orthopnea which had improved. Leg edema was better. She was scheduled for lexiscan myoview 02/2018 that revealed EF 54%, no evidence of prior infarct or ischemia.   She has maintained consistent follow-up without hospitalization. Last cardiology clinic visit was 12/16/21 with Dr. Acie Fredrickson.  At that time she had no specific cardiac concerns and no medication changes were made.On lab testing, potassium was low at 3.4 and she was advised to resume K-Dur 10 milliequivalents which had previously been prescribed but she had not been taking. Potassium normalized on recheck a few weeks later.   Today, she is here for annual follow-up. Reports she is feeling well, continues to lives alone and does a lot of cooking, sewing, and goes to church. Feels like she is slower but no shortness of breath. Rests when she feels that she needs to. Sometimes  feel like her heart is slow, no presyncope or syncope. Home BP 130s over 65-70s. Review of recent lab results reveals LDL 150 on 10/19/22. She denies chest pain, shortness of breath, lower extremity edema, palpitations, melena, hematuria, hemoptysis, diaphoresis, weakness, presyncope, syncope, orthopnea, and PND. No specific cardiac concerns. Needs refills on her medications.   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   ezetimibe (ZETIA) 10 MG tablet Take 1 tablet (10 mg total) by mouth daily.   ferrous sulfate 325 (65 FE) MG EC tablet Take 1 tablet by mouth 2 (two) times daily.   levothyroxine (SYNTHROID, LEVOTHROID) 25 MCG tablet Take 25 mcg by mouth daily.   nitroGLYCERIN (NITROSTAT) 0.4 MG SL tablet Place 1 tablet (0.4 mg total) under the tongue every 5 (five) minutes as needed.   potassium chloride (KLOR-CON) 10 MEQ tablet Take 1 tablet (10 mEq total) by mouth daily.   rosuvastatin (CRESTOR) 20 MG tablet Take 1 tablet (20 mg total) by mouth daily.   Vitamin D, Ergocalciferol, (DRISDOL) 1.25 MG (50000 UNIT) CAPS capsule Take 50,000 Units by mouth once a week.   [DISCONTINUED] amLODipine (  NORVASC) 5 MG tablet Take 1 tablet by mouth once daily   [DISCONTINUED] carvedilol (COREG) 12.5 MG tablet Take 1 tablet by mouth twice daily   [DISCONTINUED] chlorthalidone (HYGROTON) 25 MG tablet Take 1 tablet by mouth once daily     Allergies:   Patient has no known allergies.   Social History   Socioeconomic History   Marital status: Widowed    Spouse name: Not on file   Number of children: 2   Years of education: Not on file   Highest education level: Not on file  Occupational History   Occupation: retired   Tobacco Use   Smoking status: Never   Smokeless tobacco: Never  Vaping Use   Vaping Use: Never used  Substance and Sexual Activity   Alcohol use: No   Drug use: No   Sexual activity: Not Currently  Other Topics Concern   Not on file  Social History Narrative   Not on file   Social Determinants of Health   Financial Resource Strain: Not on file  Food Insecurity: Not on file  Transportation Needs: Not on file  Physical Activity: Not on file  Stress: Not on file  Social Connections: Not on file     Family History: The patient's family history includes Cancer in her mother and another family member; Cancer - Lung in her father; Diabetes in her brother; Stroke in an other family member.  ROS:   Please see the history of present illness.   All other systems reviewed and are negative.  Labs/Other Studies Reviewed:    The following studies were reviewed today:  Lexiscan Myoview 02/2018 Nuclear stress EF: 54%. There was no ST segment deviation noted during stress. The study is normal.   Normal pharmacologic nuclear stress test with no evidence for prior infarct or ischemia.   Recent Labs: 12/16/2021: ALT 15 12/27/2021: BUN 10; Creatinine, Ser 0.94; Potassium 4.0; Sodium 141  Recent Lipid Panel    Component Value Date/Time   CHOL 169 12/16/2021 1052   TRIG 65 12/16/2021 1052   HDL 56 12/16/2021 1052   CHOLHDL 3.0 12/16/2021 1052   LDLCALC 100 (H) 12/16/2021 1052     Risk Assessment/Calculations:       Physical Exam:    VS:  BP 138/70   Pulse 66   Ht 5' (1.524 m)   Wt 161 lb 9.6 oz (73.3 kg)   SpO2 98%   BMI 31.56 kg/m     Wt Readings from Last 3 Encounters:  12/11/22 161 lb 9.6 oz (73.3 kg)  12/16/21 167 lb 3.2 oz (75.8 kg)  12/31/20 171 lb 3.2 oz (77.7 kg)     GEN:  Well nourished, well developed in no acute distress HEENT: Normal NECK: No JVD; No carotid bruits CARDIAC: RRR, 2/6 systolic murmur. No rubs, gallops RESPIRATORY:  Clear to auscultation  without rales, wheezing or rhonchi  ABDOMEN: Soft, non-tender, non-distended MUSCULOSKELETAL:  No edema; No deformity. 2+ pedal pulses, equal bilaterally SKIN: Warm and dry NEUROLOGIC:  Alert and oriented x 3 PSYCHIATRIC:  Normal affect   EKG:  EKG is  ordered today.  The ekg ordered today demonstrates normal sinus rhythm at 66 bpm, LBBB, no acute change from previus tracing    Diagnoses:    1. Coronary artery disease involving native coronary artery of native heart without angina pectoris   2. Hyperlipidemia LDL goal <70   3. Chronic diastolic heart failure (HCC)   4. Murmur   5. LBBB (left bundle branch  block)   6. Essential hypertension    Assessment and Plan:     CAD without angina: History of MI and stent to LCx 2006. Normal NST 02/2018. She denies chest pain, dyspnea, or other symptoms concerning for angina. No indication for further ischemic evaluation at this time. Encouraged better cholesterol control as noted below. Will add Zetia. Continue amlodipine, aspirin, carvedilol, rosuvastatin.  Hyperlipidemia LDL goal < 70: LDL 150 on 34/28/76. Emphasized importance of LDL < 70. Agreeable to start Zetia 10 mg in addition to rosuvastatin 20 mg daily.  Will recheck lipid/ALT in 2 to 3 months.  She has asked that this be done at PCP office.  Advised I will forward my note and have given her this information for follow-up.  LBBB: Known. EKG today reveals NSR at 66 bpm, no significant change from previous tracing. Asymptomatic. We discussed repeating echocardiogram as noted below. She would prefer to hold off for now.  Murmur: Systolic murmur noted on exam. Reports this was originally identified in 1979. No echo to review. Discussed repeating echo for evaluation of valve function but she would like to hold off for now. She is asymptomatic. Advised her to notify us if she develops any concerning symptoms prior to next office visit.   Hypertension: BP is well controlled. Scr 0.89 on lab work  from PCP 10/19/22. Needs medication refills today.   Chronic HFpEF: History of HFpEF per records review, original echo not available for review. Appears euvolemic on exam today. She denies dyspnea, edema, orthopnea, PND.  Continue chlorthalidone, carvedilol, amlodipine.     Disposition: 1 year with Dr. Elease Hashimoto  Medication Adjustments/Labs and Tests Ordered: Current medicines are reviewed at length with the patient today.  Concerns regarding medicines are outlined above.  Orders Placed This Encounter  Procedures   EKG 12-Lead   Meds ordered this encounter  Medications   amLODipine (NORVASC) 5 MG tablet    Sig: Take 1 tablet (5 mg total) by mouth daily.    Dispense:  90 tablet    Refill:  3   carvedilol (COREG) 12.5 MG tablet    Sig: Take 1 tablet (12.5 mg total) by mouth 2 (two) times daily.    Dispense:  180 tablet    Refill:  3   chlorthalidone (HYGROTON) 25 MG tablet    Sig: Take 1 tablet (25 mg total) by mouth daily.    Dispense:  90 tablet    Refill:  3   ezetimibe (ZETIA) 10 MG tablet    Sig: Take 1 tablet (10 mg total) by mouth daily.    Dispense:  90 tablet    Refill:  3    Patient Instructions  Medication Instructions:   START Zetia one (1) tablet by mouth ( 10 mg) daily.   *If you need a refill on your cardiac medications before your next appointment, please call your pharmacy*   Lab Work:  Please get LIPID/ALT at your PCP.   If you have labs (blood work) drawn today and your tests are completely normal, you will receive your results only by: MyChart Message (if you have MyChart) OR A paper copy in the mail If you have any lab test that is abnormal or we need to change your treatment, we will call you to review the results.   Testing/Procedures:  None ordered.   Follow-Up: At [facility], you and your health needs are our priority.  As part of our continuing mission to provide you with exceptional heart care,  we have created designated  Provider Care Teams.  These Care Teams include your primary Cardiologist (physician) and Advanced Practice Providers (APPs -  Physician Assistants and Nurse Practitioners) who all work together to provide you with the care you need, when you need it.  We recommend signing up for the patient portal called "MyChart".  Sign up information is provided on this After Visit Summary.  MyChart is used to connect with patients for Virtual Visits (Telemedicine).  Patients are able to view lab/test results, encounter notes, upcoming appointments, etc.  Non-urgent messages can be sent to your provider as well.   To learn more about what you can do with MyChart, go to [email or web address].    Your next appointment:   1 year(s)  The format for your next appointment:   In Person  Provider:   Kristeen Miss, MD     Other Instructions  Your physician wants you to follow-up in: 1 year with Dr. Elease Hashimoto. You will receive a reminder letter in the mail two months in advance. If you don't receive a letter, please call our office to schedule the follow-up appointment.   Important Information About Sugar         Signed, Levi Aland, NP  12/11/2022 2:08 PM    [facility]

## 2022-12-11 ENCOUNTER — Encounter: Payer: Self-pay | Admitting: Nurse Practitioner

## 2022-12-11 ENCOUNTER — Ambulatory Visit: Payer: Medicare HMO | Attending: Nurse Practitioner | Admitting: Nurse Practitioner

## 2022-12-11 VITALS — BP 138/70 | HR 66 | Ht 60.0 in | Wt 161.6 lb

## 2022-12-11 DIAGNOSIS — R011 Cardiac murmur, unspecified: Secondary | ICD-10-CM

## 2022-12-11 DIAGNOSIS — I2510 Atherosclerotic heart disease of native coronary artery without angina pectoris: Secondary | ICD-10-CM | POA: Diagnosis not present

## 2022-12-11 DIAGNOSIS — I5032 Chronic diastolic (congestive) heart failure: Secondary | ICD-10-CM

## 2022-12-11 DIAGNOSIS — E785 Hyperlipidemia, unspecified: Secondary | ICD-10-CM | POA: Diagnosis not present

## 2022-12-11 DIAGNOSIS — I447 Left bundle-branch block, unspecified: Secondary | ICD-10-CM

## 2022-12-11 DIAGNOSIS — I10 Essential (primary) hypertension: Secondary | ICD-10-CM

## 2022-12-11 MED ORDER — EZETIMIBE 10 MG PO TABS: 10.0000 mg | ORAL_TABLET | Freq: Every day | ORAL | 3 refills | Status: DC

## 2022-12-11 MED ORDER — CARVEDILOL 12.5 MG PO TABS: 12.5000 mg | ORAL_TABLET | Freq: Two times a day (BID) | ORAL | 3 refills | Status: DC

## 2022-12-11 MED ORDER — CHLORTHALIDONE 25 MG PO TABS: 25.0000 mg | ORAL_TABLET | Freq: Every day | ORAL | 3 refills | Status: DC

## 2022-12-11 MED ORDER — AMLODIPINE BESYLATE 5 MG PO TABS: 5.0000 mg | ORAL_TABLET | Freq: Every day | ORAL | 3 refills | Status: DC

## 2022-12-11 NOTE — Patient Instructions (Signed)
Medication Instructions:   START Zetia one (1) tablet by mouth ( 10 mg) daily.   *If you need a refill on your cardiac medications before your next appointment, please call your pharmacy*   Lab Work:  Please get LIPID/ALT at your PCP.   If you have labs (blood work) drawn today and your tests are completely normal, you will receive your results only by: [place] (if you have MyChart) OR A paper copy in the mail If you have any lab test that is abnormal or we need to change your treatment, we will call you to review the results.   Testing/Procedures:  None ordered.   Follow-Up: At [facility], you and your health needs are our priority.  As part of our continuing mission to provide you with exceptional heart care, we have created designated Provider Care Teams.  These Care Teams include your primary Cardiologist (physician) and Advanced Practice Providers (APPs -  Physician Assistants and Nurse Practitioners) who all work together to provide you with the care you need, when you need it.  We recommend signing up for the patient portal called "MyChart".  Sign up information is provided on this After Visit Summary.  MyChart is used to connect with patients for Virtual Visits (Telemedicine).  Patients are able to view lab/test results, encounter notes, upcoming appointments, etc.  Non-urgent messages can be sent to your provider as well.   To learn more about what you can do with MyChart, go to [email or web address].    Your next appointment:   1 year(s)  The format for your next appointment:   In Person  Provider:   Mertie Moores, MD     Other Instructions  Your physician wants you to follow-up in: 1 year with Dr. Acie Fredrickson. You will receive a reminder letter in the mail two months in advance. If you don't receive a letter, please call our office to schedule the follow-up appointment.   Important Information About Sugar

## 2023-04-20 LAB — LAB REPORT - SCANNED
A1c: 5.6
EGFR: 52

## 2023-10-16 LAB — LAB REPORT - SCANNED
A1c: 5.7
EGFR: 58

## 2023-12-16 ENCOUNTER — Encounter: Payer: Self-pay | Admitting: Cardiovascular Disease

## 2023-12-16 NOTE — Progress Notes (Signed)
 Cardiology Office Note:    Date:  12/19/2023   ID:  Amy Stevenson, DOB 01-Apr-1933, MRN [id number]  PCP:  Alta Jersey, NP  Cardiologist:  Ahmad Alert, MD    Referring MD: Alta Jersey, NP   Problem list 1.  Congestive heart failure 2.  Essential hypertension 3.  Hyperlipidemia 4.  CAD :   S/p stenting of the LCx  - 2006    Chief Complaint  Patient presents with   Hypertension        Hyperlipidemia   Coronary Artery Disease         Previous Notes:   Pt seen with Daughter, Deatra Face.  Amy Stevenson is a 89 y.o. female with a hx of hypertension, hyperlipidemia with a recent diagnosis of congestive heart failure.  We are asked to see her today by Alta Jersey, FNT ( [facility]) for further evaluation of her congestive heart failure. Previous pat of Dr. Ardyce Bee She had a myocardial infarction in 2006 and had stenting of her left circumflex artery by Dr. [place].  She has had some progressive shortness of breath for the past couple months.  She was in the hospital the week before Thanksgiving up in [place] .  She was diuresed and felt much better.  She had an echocardiogram during that hospitalization.  We do not have the results of that echocardiogram   She was scheduled to have a stress test.  She did not feel up to the stress test and wanted to wait and see me before she reschedule the stress test.  Since the hospitalization she has had some heart flutters. She is sleeping better ( was having PND and orthopnea ) , ankle swelling is better  Has not had any chest pain  Does not get any exercise.   Is able to do her housework .  Lives by herself. Does her own shopping.     Does have some DOE with bringing in the groceries.   March 08, 2018:  Amy Stevenson is seen today for follow-up of her hypertension, hyperlipidemia and congestive heart failure. She was seen in March and was reported having some increased shortness of breath.  Myoview  study was done which revealed an  ejection fraction of 54%.  She did not have any evidence of ischemia.  Has been under lots of stress  - sister died several months ago and this am found out that her sister in law had passed.  BP is elevated today .  Still using salt regularly   Continues to have some left sided chest pain ( normal myoview  last month)  Thinks it may be gas.   June 04, 2018:  Amy Stevenson is seen today for follow-up of her chronic diastolic congestive heart failure, hypertension and hyperlipidemia.  She has a remote history of coronary artery disease with stenting of the left circumflex artery in 2006.    Is taking her meds.    Still eats salt frequently  Ran out of her Chlorthalidone  and lasix  last week   December 22, 2019:  Amy Stevenson is seen today for a follow-up visit regarding her hypertension, coronary artery disease, dyslipidemia. Walks around her house,   No cP , no dyspnea  BP is ok Uses a little salt  Admits that she does not take her rosuvastatin  every day   June 21, 2020:  Feels ok Able to do her normal activities at home.  Some exercise.  Sleeps well .  Has a primary MD near [place], [place]  She comes from  a family of 14 children.   She has lost all of her siblings except 1  [place] farm.    Jan. 28, 2022:  Amy Stevenson is seen today for follow up of her HTN and HLD No cardiac issues.  No cp or dyspnea  Sees Dr. Alta Jersey in [place]  VS look good     Jan. 13, 2023 Amy Stevenson is seen today for follow up of her HTN, HLD, CHF  LVEF is normal by myoview  No cp  Breathing is good Still eating some salty foods ( occasional ham )  Walks some - unless its too cold  No recent echo Myoview  showed EF 54%   Jan. 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   FEROSUL 325 (65 Fe) MG tablet Take 325 mg by mouth 2 (two) times daily.   levothyroxine (SYNTHROID, LEVOTHROID) 25 MCG tablet Take 25 mcg by mouth daily.   [DISCONTINUED] nitroGLYCERIN  (NITROSTAT ) 0.4 MG SL tablet Place 1 tablet (0.4 mg total) under the tongue every 5 (five) minutes as needed.     Allergies:   Patient has no known allergies.   Social History   Socioeconomic History   Marital status: Widowed    Spouse name: Not on file   Number of children: 2   Years of education: Not on file   Highest education level: Not on file  Occupational History   Occupation: retired  Tobacco Use   Smoking status: Never   Smokeless tobacco: Never  Vaping Use   Vaping status: Never Used  Substance and Sexual Activity   Alcohol use: No   Drug use: No   Sexual activity: Not Currently  Other Topics Concern   Not on file  Social History Narrative   Not on file   Social Drivers of Health   Financial Resource Strain: Not on file  Food Insecurity: Not on file  Transportation Needs: Not on file  Physical Activity: Not on file  Stress: Not on file  Social Connections: Not on file     Family History: The patient's family history includes Cancer in her mother and another family member; Cancer - Lung in her father; Diabetes in her brother; Stroke in an other family member. ROS:   Please see the history of present illness.     All other systems reviewed and are negative.  EKGs/Labs/Other Studies Reviewed:    The following studies were reviewed today:   Recent Labs: No results found for requested labs within last 365 days.  Recent Lipid Panel    Component Value Date/Time   CHOL 169 12/16/2021 1052   TRIG 65 12/16/2021 1052   HDL 56 12/16/2021 1052   CHOLHDL 3.0  12/16/2021 1052   LDLCALC 100 (H) 12/16/2021 1052     Physical Exam: Blood pressure 128/62, pulse 71, height 4\' 11"  (1.499 m), weight 164 lb 12.8 oz (74.8  kg), SpO2 99%.       GEN:  Well nourished, well developed in no acute distress HEENT: Normal NECK: No JVD; No carotid bruits LYMPHATICS: No lymphadenopathy CARDIAC: RRR , soft systolic murmur  RESPIRATORY:  Clear to auscultation without rales, wheezing or rhonchi  ABDOMEN: Soft, non-tender, non-distended MUSCULOSKELETAL:  No edema; No deformity  SKIN: Warm and dry NEUROLOGIC:  Alert and oriented x 3    EKG:      EKG Interpretation Date/Time:  Wednesday December 19 2023 15:34:37 EST Ventricular Rate:  67 PR Interval:  150 QRS Duration:  136 QT Interval:  440 QTC Calculation: 464 R Axis:   99  Text Interpretation: Normal sinus rhythm Rightward axis Non-specific intra-ventricular conduction block Cannot rule out Anterior infarct , age undetermined T wave abnormality, consider lateral ischemia When compared with ECG of 07-Dec-2004 04:24, QRS duration has increased Minimal criteria for Anterior infarct are now Present QT has lengthened Confirmed by Ahmad Alert ([id number]) on 12/19/2023 3:47:03 PM     ASSESSMENT:    1. Coronary artery disease involving native coronary artery of native heart without angina pectoris   2. Chronic diastolic heart failure (HCC)   3. Primary hypertension   4. Mixed hyperlipidemia      PLAN:       1.  Acute on chronic congestive heart failure.    No symptoms at present .   Cont to watch her salt   2.  Coronary artery disease:    Hx of stenting of her LCx.    No angina   2.  Hyperlipidemia:    For some reason she had stopped taking her rosuvastatin .  Amy refill it today.  Amy checking levels today.    3.  Essential hypertension:     - BP is well controlled.     Medication Adjustments/Labs and Tests Ordered: Current medicines are reviewed at length with the patient today.  Concerns  regarding medicines are outlined above.  Orders Placed This Encounter  Procedures   Basic metabolic panel   ALT   Lipid panel   EKG 12-Lead   Meds ordered this encounter  Medications   carvedilol  (COREG ) 12.5 MG tablet    Sig: Take 1 tablet (12.5 mg total) by mouth 2 (two) times daily.    Dispense:  180 tablet    Refill:  3   amLODipine  (NORVASC ) 5 MG tablet    Sig: Take 1 tablet (5 mg total) by mouth daily.    Dispense:  90 tablet    Refill:  3   chlorthalidone  (HYGROTON ) 25 MG tablet    Sig: Take 1 tablet (25 mg total) by mouth daily.    Dispense:  90 tablet    Refill:  3   ezetimibe  (ZETIA ) 10 MG tablet    Sig: Take 1 tablet (10 mg total) by mouth daily.    Dispense:  90 tablet    Refill:  3   nitroGLYCERIN  (NITROSTAT ) 0.4 MG SL tablet    Sig: Place 1 tablet (0.4 mg total) under the tongue every 5 (five) minutes as needed.    Dispense:  25 tablet    Refill:  11   rosuvastatin  (CRESTOR ) 20 MG tablet    Sig: Take 1 tablet (20 mg total) by mouth daily.    Dispense:  90 tablet    Refill:  3      Signed, Ahmad Alert, MD  12/19/2023 3:59 PM    [facility]

## 2023-12-19 ENCOUNTER — Ambulatory Visit: Payer: Medicare HMO | Attending: Cardiovascular Disease | Admitting: Cardiovascular Disease

## 2023-12-19 VITALS — BP 128/62 | HR 71 | Ht 59.0 in | Wt 164.8 lb

## 2023-12-19 DIAGNOSIS — I10 Essential (primary) hypertension: Secondary | ICD-10-CM | POA: Diagnosis not present

## 2023-12-19 DIAGNOSIS — E782 Mixed hyperlipidemia: Secondary | ICD-10-CM | POA: Diagnosis not present

## 2023-12-19 DIAGNOSIS — I2510 Atherosclerotic heart disease of native coronary artery without angina pectoris: Secondary | ICD-10-CM | POA: Diagnosis not present

## 2023-12-19 DIAGNOSIS — I5032 Chronic diastolic (congestive) heart failure: Secondary | ICD-10-CM

## 2023-12-19 MED ORDER — CHLORTHALIDONE 25 MG PO TABS: 25.0000 mg | ORAL_TABLET | Freq: Every day | ORAL | 3 refills | Status: AC

## 2023-12-19 MED ORDER — EZETIMIBE 10 MG PO TABS: 10.0000 mg | ORAL_TABLET | Freq: Every day | ORAL | 3 refills | Status: AC

## 2023-12-19 MED ORDER — ROSUVASTATIN CALCIUM 20 MG PO TABS: 20.0000 mg | ORAL_TABLET | Freq: Every day | ORAL | 3 refills | Status: AC

## 2023-12-19 MED ORDER — AMLODIPINE BESYLATE 5 MG PO TABS: 5.0000 mg | ORAL_TABLET | Freq: Every day | ORAL | 3 refills | Status: AC

## 2023-12-19 MED ORDER — CARVEDILOL 12.5 MG PO TABS: 12.5000 mg | ORAL_TABLET | Freq: Two times a day (BID) | ORAL | 3 refills | Status: AC

## 2023-12-19 MED ORDER — NITROGLYCERIN 0.4 MG SL SUBL: 0.4000 mg | SUBLINGUAL_TABLET | SUBLINGUAL | 11 refills | Status: AC | PRN

## 2023-12-19 NOTE — Patient Instructions (Signed)
 Medication Instructions:  Cardiac medications have been refilled  *If you need a refill on your cardiac medications before your next appointment, please call your pharmacy*   Lab Work: Today: Lipid panel, ALT and BMET  If you have labs (blood work) drawn today and your tests are completely normal, you will receive your results only by: MyChart Message (if you have MyChart) OR A paper copy in the mail If you have any lab test that is abnormal or we need to change your treatment, we will call you to review the results.  Follow-Up: At [facility], you and your health needs are our priority.  As part of our continuing mission to provide you with exceptional heart care, we have created designated Provider Care Teams.  These Care Teams include your primary Cardiologist (physician) and Advanced Practice Providers (APPs -  Physician Assistants and Nurse Practitioners) who all work together to provide you with the care you need, when you need it.  We recommend signing up for the patient portal called "MyChart".  Sign up information is provided on this After Visit Summary.  MyChart is used to connect with patients for Virtual Visits (Telemedicine).  Patients are able to view lab/test results, encounter notes, upcoming appointments, etc.  Non-urgent messages can be sent to your provider as well.   To learn more about what you can do with MyChart, go to [email or web address].    Your next appointment:   1 year(s)  Provider:   Ola Berger, MD     1st Floor: - Lobby - Registration  - Pharmacy  - Lab - Cafe  2nd Floor: - PV Lab - Diagnostic Testing (echo, CT, nuclear med)  3rd Floor: - Vacant  4th Floor: - TCTS (cardiothoracic surgery) - AFib Clinic - Structural Heart Clinic - Vascular Surgery  - Vascular Ultrasound  5th Floor: - HeartCare Cardiology (general and EP) - Clinical Pharmacy for coumadin, hypertension, lipid, weight-loss medications, and med management  appointments    Valet parking services will be available as well.

## 2023-12-20 LAB — BASIC METABOLIC PANEL
BUN/Creatinine Ratio: 12 (ref 12–28)
BUN: 13 mg/dL (ref 8–27)
CO2: 22 mmol/L (ref 20–29)
Calcium: 9.9 mg/dL (ref 8.7–10.3)
Chloride: 102 mmol/L (ref 96–106)
Creatinine, Ser: 1.1 mg/dL — ABNORMAL HIGH (ref 0.57–1.00)
Glucose: 90 mg/dL (ref 70–99)
Potassium: 4.5 mmol/L (ref 3.5–5.2)
Sodium: 143 mmol/L (ref 134–144)
eGFR: 48 mL/min/{1.73_m2} — ABNORMAL LOW (ref >59)

## 2023-12-20 LAB — LIPID PANEL
Chol/HDL Ratio: 4.6 {ratio} — ABNORMAL HIGH (ref 0.0–4.4)
Cholesterol, Total: 266 mg/dL — ABNORMAL HIGH (ref 100–199)
HDL: 58 mg/dL (ref >39)
LDL Chol Calc (NIH): 186 mg/dL — ABNORMAL HIGH (ref 0–99)
Triglycerides: 125 mg/dL (ref 0–149)
VLDL Cholesterol Cal: 22 mg/dL (ref 5–40)

## 2023-12-20 LAB — ALT: ALT: 16 [IU]/L (ref 0–32)

## 2023-12-26 ENCOUNTER — Telehealth: Payer: Self-pay

## 2023-12-26 NOTE — Telephone Encounter (Signed)
-----   Message from Kristeen Miss sent at 12/20/2023  9:08 AM EST ----- BMP is stable Continue current medications / current plans  Lipids are pending

## 2023-12-26 NOTE — Telephone Encounter (Signed)
Patient is returning call. Please advise? 

## 2023-12-27 NOTE — Telephone Encounter (Signed)
Provided results to patient and all questions answered.

## 2024-10-25 LAB — LAB REPORT - SCANNED
A1c: 5.4
EGFR: 64
TSH: 2.65 (ref 0.41–5.90)

## 2024-11-09 ENCOUNTER — Ambulatory Visit: Payer: Self-pay | Admitting: Internal Medicine

## 2024-12-21 NOTE — Progress Notes (Unsigned)
 " Cardiology Office Note:    Date:  12/21/2024   ID:  Amy Stevenson, DOB [birth year], MRN [id number]  PCP:  Durenda Ards, NP  Cardiologist:  Vina Gull, MD    Referring MD: Durenda Ards, NP   Problem list 1.  Congestive heart failure 2.  Essential hypertension 3.  Hyperlipidemia 4.  CAD :   S/p stenting of the LCx  - 2006    No chief complaint on file.   Previous Notes:    Amy Stevenson is a 89 y.o. female with a hx of hypertension, hyperlipidemia with a recent diagnosis of congestive heart failure.  Previously followed by  P Nahser   2006   Pt She had a myocardial infarction Undewent PTCA/stent to LCx [name])  She has had some progressive shortness of breath for the past couple months.  She was in the hospital the week before Thanksgiving up in [place] .  She was diuresed and felt much better.  She had an echocardiogram during that hospitalization.  We do not have the results of that echocardiogram   She was scheduled to have a stress test.  She did not feel up to the stress test and wanted to wait and see me before she reschedule the stress test.  Since the hospitalization she has had some heart flutters. She is sleeping better ( was having PND and orthopnea ) , ankle swelling is better  Has not had any chest pain  Does not get any exercise.   Is able to do her housework .  Lives by herself. Does her own shopping.     Does have some DOE with bringing in the groceries.   March 2019: Myovew showed no ischemia  LVEF 54%    Amy Stevenson is seen today for follow-up of her hypertension, hyperlipidemia and congestive heart failure. She was seen in March and was reported having some increased shortness of breath.  Myoview  study was done which revealed an ejection fraction of 54%.  She did not have any evidence of ischemia.  Has been under lots of stress  - sister died several months ago and this am found out that her sister in law had passed.  BP is elevated today .  Still  using salt regularly   Continues to have some left sided chest pain ( normal myoview  last month)  Thinks it may be gas.   June 04, 2018:  December 22, 2019:  [name] is seen today for a follow-up visit regarding her hypertension, coronary artery disease, dyslipidemia. Walks around her house,   No cP , no dyspnea  BP is ok Uses a little salt  Admits that she does not take her rosuvastatin  every day   June 21, 2020:  Feels ok Able to do her normal activities at home.  Some exercise.  Sleeps well .  Has a primary MD near [place], [place]  She comes from a family of 14 children.   She has lost all of her siblings except 1  She [place] farm.    Jan. 28, 2022:  Amy Stevenson is seen today for follow up of her HTN and HLD No cardiac issues.  No cp or dyspnea  Sees Dr. Ards Durenda in [place]  VS look good     Jan. 13, 2023 Amy Stevenson is seen today for follow up of her HTN, HLD, CHF  LVEF is normal by myoview  No cp  Breathing is good Still eating some salty foods ( occasional ham )  Walks some - unless its too cold  No recent echo Myoview  showed EF 54%   Jan. 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     Allergies:   Patient has no known allergies.   Social History   Socioeconomic History   Marital status: Widowed    Spouse name: Not on file   Number of children: 2   Years of education: Not on file   Highest education level: Not on file  Occupational History   Occupation: retired  Tobacco  Use   Smoking status: Never   Smokeless tobacco: Never  Vaping Use   Vaping status: Never Used  Substance and Sexual Activity   Alcohol use: No   Drug use: No   Sexual activity: Not Currently  Other Topics Concern   Not on file  Social History Narrative   Not on file   Social Drivers of Health   Tobacco Use: Low Risk (06/04/2024)   Received from [facility]   Patient History    Smoking Tobacco Use: Never    Smokeless Tobacco Use: Never    Passive Exposure: Not on file  Financial Resource Strain: Not on file  Food Insecurity: Not on file  Transportation Needs: Not on file  Physical Activity: Not on file  Stress: Not on file  Social Connections: Not on file  Depression (EYV7-0): Not on file  Alcohol Screen: Not on file  Housing: Not on file  Utilities: Not on file  Health Literacy: Not on file     Family History: The patient's family history includes Cancer in her mother and another family member; Cancer - Lung in her father; Diabetes in her brother; Stroke in an other family member. ROS:   Please see the history of present illness.     All other systems reviewed and are negative.  EKGs/Labs/Other Studies Reviewed:    The following studies were reviewed today:   Recent Labs: 10/22/2024: TSH 2.65  Recent Lipid Panel    Component Value Date/Time   CHOL 266 (H) 12/19/2023 1625   TRIG 125 12/19/2023 1625   HDL 58 12/19/2023 1625   CHOLHDL 4.6 (H) 12/19/2023 1625   LDLCALC 186 (H) 12/19/2023 1625     Physical Exam: There were no vitals taken for this visit.  No BP recorded.  {Refresh Note OR Click here to enter BP  :1}***    GEN:  Well nourished, well developed in no acute distress HEENT: Normal NECK: No JVD; No carotid bruits LYMPHATICS: No lymphadenopathy CARDIAC: RRR , soft systolic murmur  RESPIRATORY:  Clear to auscultation without rales, wheezing or rhonchi  ABDOMEN: Soft, non-tender, non-distended MUSCULOSKELETAL:  No edema; No deformity   SKIN: Warm and dry NEUROLOGIC:  Alert and oriented x 3    EKG:            ASSESSMENT:    No diagnosis found.  PLAN:       1.  Acute on chronic congestive heart failure.    No symptoms at present .   Cont to watch her salt   2.  Coronary artery disease:    Hx of stenting of her LCx.    No angina   2.  Hyperlipidemia:    For some reason she had stopped taking her rosuvastatin .  Will refill it today.  Will checking levels today.    3.  Essential hypertension:     - BP is well controlled.     Medication Adjustments/Labs and Tests Ordered: Current medicines are reviewed at length with the patient today.  Concerns regarding medicines are outlined above.  No orders of the defined types were placed in this encounter.  No orders of the defined types were placed in this encounter.     Signed, Vina Gull, MD  12/21/2024 4:40 PM    [facility] "

## 2024-12-22 ENCOUNTER — Ambulatory Visit: Payer: Self-pay | Admitting: Internal Medicine

## 2024-12-22 ENCOUNTER — Ambulatory Visit: Attending: Internal Medicine | Admitting: Internal Medicine

## 2024-12-22 ENCOUNTER — Encounter: Payer: Self-pay | Admitting: Internal Medicine

## 2024-12-22 ENCOUNTER — Other Ambulatory Visit: Payer: Self-pay

## 2024-12-22 VITALS — BP 122/74 | HR 69 | Ht <= 58 in | Wt 156.0 lb

## 2024-12-22 DIAGNOSIS — E782 Mixed hyperlipidemia: Secondary | ICD-10-CM

## 2024-12-22 DIAGNOSIS — I10 Essential (primary) hypertension: Secondary | ICD-10-CM

## 2024-12-22 DIAGNOSIS — D649 Anemia, unspecified: Secondary | ICD-10-CM

## 2024-12-22 DIAGNOSIS — I2510 Atherosclerotic heart disease of native coronary artery without angina pectoris: Secondary | ICD-10-CM | POA: Diagnosis not present

## 2024-12-22 DIAGNOSIS — I5032 Chronic diastolic (congestive) heart failure: Secondary | ICD-10-CM | POA: Diagnosis not present

## 2024-12-22 DIAGNOSIS — R011 Cardiac murmur, unspecified: Secondary | ICD-10-CM | POA: Diagnosis not present

## 2024-12-22 LAB — CBC
Hematocrit: 27.9 % — ABNORMAL LOW (ref 34.0–46.6)
Hemoglobin: 7.5 g/dL — ABNORMAL LOW (ref 11.1–15.9)
MCH: 18.5 pg — ABNORMAL LOW (ref 26.6–33.0)
MCHC: 26.9 g/dL — ABNORMAL LOW (ref 31.5–35.7)
MCV: 69 fL — ABNORMAL LOW (ref 79–97)
Platelets: 320 x10E3/uL (ref 150–450)
RBC: 4.06 x10E6/uL (ref 3.77–5.28)
RDW: 18.3 % — ABNORMAL HIGH (ref 11.7–15.4)
WBC: 5.8 x10E3/uL (ref 3.4–10.8)

## 2024-12-22 NOTE — Patient Instructions (Signed)
 Medication Instructions:  Your physician recommends that you continue on your current medications as directed. Please refer to the Current Medication list given to you today.  *If you need a refill on your cardiac medications before your next appointment, please call your pharmacy*  Lab Work: LIPID, NMR, CBC, BMET If you have labs (blood work) drawn today and your tests are completely normal, you will receive your results only by: MyChart Message (if you have MyChart) OR A paper copy in the mail If you have any lab test that is abnormal or we need to change your treatment, we will call you to review the results.  Testing/Procedures: Your physician has requested that you have an echocardiogram. Echocardiography is a painless test that uses sound waves to create images of your heart. It provides your doctor with information about the size and shape of your heart and how well your hearts chambers and valves are working. This procedure takes approximately one hour. There are no restrictions for this procedure. Please do NOT wear cologne, perfume, aftershave, or lotions (deodorant is allowed). Please arrive 15 minutes prior to your appointment time.  Please note: We ask at that you not bring children with you during ultrasound (echo/ vascular) testing. Due to room size and safety concerns, children are not allowed in the ultrasound rooms during exams. Our front office staff cannot provide observation of children in our lobby area while testing is being conducted. An adult accompanying a patient to their appointment will only be allowed in the ultrasound room at the discretion of the ultrasound technician under special circumstances. We apologize for any inconvenience.   Follow-Up: At [facility], you and your health needs are our priority.  As part of our continuing mission to provide you with exceptional heart care, our providers are all part of one team.  This team includes your primary  Cardiologist (physician) and Advanced Practice Providers or APPs (Physician Assistants and Nurse Practitioners) who all work together to provide you with the care you need, when you need it.  Your next appointment:   1 year(s)  Provider:   DR. OKEY  We recommend signing up for the patient portal called MyChart.  Sign up information is provided on this After Visit Summary.  MyChart is used to connect with patients for Virtual Visits (Telemedicine).  Patients are able to view lab/test results, encounter notes, upcoming appointments, etc.  Non-urgent messages can be sent to your provider as well.   To learn more about what you can do with MyChart, go to [email or web address].   Other Instructions

## 2024-12-22 NOTE — Progress Notes (Signed)
 Released labs

## 2024-12-23 LAB — LIPID PANEL
Chol/HDL Ratio: 2.5 ratio (ref 0.0–4.4)
Cholesterol, Total: 135 mg/dL (ref 100–199)
HDL: 55 mg/dL
LDL Chol Calc (NIH): 66 mg/dL (ref 0–99)
Triglycerides: 66 mg/dL (ref 0–149)
VLDL Cholesterol Cal: 14 mg/dL (ref 5–40)

## 2024-12-23 LAB — BASIC METABOLIC PANEL WITH GFR
BUN/Creatinine Ratio: 13 (ref 12–28)
BUN: 13 mg/dL (ref 8–27)
CO2: 22 mmol/L (ref 20–29)
Calcium: 9.2 mg/dL (ref 8.7–10.3)
Chloride: 102 mmol/L (ref 96–106)
Creatinine, Ser: 1.03 mg/dL — ABNORMAL HIGH (ref 0.57–1.00)
Glucose: 106 mg/dL — ABNORMAL HIGH (ref 70–99)
Potassium: 3.7 mmol/L (ref 3.5–5.2)
Sodium: 139 mmol/L (ref 134–144)
eGFR: 52 mL/min/1.73 — ABNORMAL LOW

## 2024-12-23 LAB — NMR, LIPOPROFILE
Cholesterol, Total: 143 mg/dL (ref 100–199)
HDL Particle Number: 27.2 umol/L — ABNORMAL LOW
HDL-C: 56 mg/dL
LDL Particle Number: 724 nmol/L
LDL Size: 21.3 nm
LDL-C (NIH Calc): 74 mg/dL (ref 0–99)
LP-IR Score: <25
Small LDL Particle Number: <90 nmol/L
Triglycerides: 64 mg/dL (ref 0–149)

## 2024-12-24 ENCOUNTER — Telehealth: Payer: Self-pay | Admitting: Internal Medicine

## 2024-12-24 NOTE — Telephone Encounter (Signed)
 Reviewed outside labs  Hgb  in 7s for at least 6 months  APppears to be Fe deficient Remote stent     No active symptoms  Given severity of anemia I would stop ASA  Check CBC in 2 months  Keep on Fe supplements

## 2024-12-24 NOTE — Addendum Note (Signed)
 Addended by: LORING ANDRIETTE HERO on: 12/24/2024 01:45 PM   Modules accepted: Orders

## 2024-12-24 NOTE — Addendum Note (Signed)
 Addended by: LORING ANDRIETTE HERO on: 12/24/2024 01:46 PM   Modules accepted: Orders

## 2024-12-24 NOTE — Telephone Encounter (Signed)
 Spoke with patient. Provided results per MD.   ASA removed from med list.   She will do CBC on 03/11/25 when in town for her echo ~2 months

## 2025-03-11 ENCOUNTER — Ambulatory Visit (HOSPITAL_COMMUNITY)
